# Patient Record
Sex: Female | Born: 1980 | Race: White | Hispanic: No | Marital: Married | Smoking: Never smoker
Health system: Southern US, Community
[De-identification: ages and names within clinical notes are randomized; demographics above are authoritative.]

## PROBLEM LIST (undated history)

## (undated) ENCOUNTER — Inpatient Hospital Stay (HOSPITAL_COMMUNITY): Payer: Self-pay

## (undated) DIAGNOSIS — Z3492 Encounter for supervision of normal pregnancy, unspecified, second trimester: Secondary | ICD-10-CM

## (undated) DIAGNOSIS — J45909 Unspecified asthma, uncomplicated: Secondary | ICD-10-CM

## (undated) HISTORY — DX: Unspecified asthma, uncomplicated: J45.909

## (undated) HISTORY — PX: WISDOM TOOTH EXTRACTION: SHX21

## (undated) HISTORY — DX: Encounter for supervision of normal pregnancy, unspecified, second trimester: Z34.92

---

## 2009-10-11 LAB — US OB DETAIL + 14 WK

## 2013-12-12 LAB — US OB COMP LESS 14 WKS

## 2013-12-22 LAB — OB RESULTS CONSOLE ABO/RH: RH Type: POSITIVE

## 2013-12-22 LAB — OB RESULTS CONSOLE ANTIBODY SCREEN: Antibody Screen: NEGATIVE

## 2013-12-22 LAB — OB RESULTS CONSOLE HIV ANTIBODY (ROUTINE TESTING): HIV: NONREACTIVE

## 2013-12-22 LAB — OB RESULTS CONSOLE HEPATITIS B SURFACE ANTIGEN: Hepatitis B Surface Ag: NEGATIVE

## 2013-12-22 LAB — OB RESULTS CONSOLE RUBELLA ANTIBODY, IGM: Rubella: IMMUNE

## 2013-12-22 LAB — OB RESULTS CONSOLE HGB/HCT, BLOOD
HCT: 38 %
Hemoglobin: 13.1 g/dL

## 2014-01-23 ENCOUNTER — Ambulatory Visit (INDEPENDENT_AMBULATORY_CARE_PROVIDER_SITE_OTHER): Payer: BC Managed Care – PPO | Admitting: Adult Health

## 2014-01-23 ENCOUNTER — Encounter: Payer: Self-pay | Admitting: Adult Health

## 2014-01-23 ENCOUNTER — Encounter (INDEPENDENT_AMBULATORY_CARE_PROVIDER_SITE_OTHER): Payer: Self-pay

## 2014-01-23 VITALS — BP 114/70 | Ht 65.0 in | Wt 168.0 lb

## 2014-01-23 DIAGNOSIS — O09299 Supervision of pregnancy with other poor reproductive or obstetric history, unspecified trimester: Secondary | ICD-10-CM

## 2014-01-23 DIAGNOSIS — Z1389 Encounter for screening for other disorder: Secondary | ICD-10-CM

## 2014-01-23 DIAGNOSIS — O34219 Maternal care for unspecified type scar from previous cesarean delivery: Secondary | ICD-10-CM

## 2014-01-23 DIAGNOSIS — Z3492 Encounter for supervision of normal pregnancy, unspecified, second trimester: Secondary | ICD-10-CM

## 2014-01-23 DIAGNOSIS — Z331 Pregnant state, incidental: Secondary | ICD-10-CM

## 2014-01-23 HISTORY — DX: Encounter for supervision of normal pregnancy, unspecified, second trimester: Z34.92

## 2014-01-23 LAB — POCT URINALYSIS DIPSTICK
Glucose, UA: NEGATIVE
Ketones, UA: NEGATIVE
Leukocytes, UA: NEGATIVE
Nitrite, UA: NEGATIVE
Protein, UA: NEGATIVE
RBC UA: NEGATIVE

## 2014-01-23 NOTE — Patient Instructions (Signed)
Second Trimester of Pregnancy The second trimester is from week 13 through week 28, months 4 through 6. The second trimester is often a time when you feel your best. Your body has also adjusted to being pregnant, and you begin to feel better physically. Usually, morning sickness has lessened or quit completely, you may have more energy, and you may have an increase in appetite. The second trimester is also a time when the fetus is growing rapidly. At the end of the sixth month, the fetus is about 9 inches long and weighs about 1 pounds. You will likely begin to feel the baby move (quickening) between 18 and 20 weeks of the pregnancy. BODY CHANGES Your body goes through many changes during pregnancy. The changes vary from woman to woman.   Your weight will continue to increase. You will notice your lower abdomen bulging out.  You may begin to get stretch marks on your hips, abdomen, and breasts.  You may develop headaches that can be relieved by medicines approved by your caregiver.  You may urinate more often because the fetus is pressing on your bladder.  You may develop or continue to have heartburn as a result of your pregnancy.  You may develop constipation because certain hormones are causing the muscles that push waste through your intestines to slow down.  You may develop hemorrhoids or swollen, bulging veins (varicose veins).  You may have back pain because of the weight gain and pregnancy hormones relaxing your joints between the bones in your pelvis and as a result of a shift in weight and the muscles that support your balance.  Your breasts will continue to grow and be tender.  Your gums may bleed and may be sensitive to brushing and flossing.  Dark spots or blotches (chloasma, mask of pregnancy) may develop on your face. This will likely fade after the baby is born.  A dark line from your belly button to the pubic area (linea nigra) may appear. This will likely fade after the  baby is born. WHAT TO EXPECT AT YOUR PRENATAL VISITS During a routine prenatal visit:  You will be weighed to make sure you and the fetus are growing normally.  Your blood pressure will be taken.  Your abdomen will be measured to track your baby's growth.  The fetal heartbeat will be listened to.  Any test results from the previous visit will be discussed. Your caregiver may ask you:  How you are feeling.  If you are feeling the baby move.  If you have had any abnormal symptoms, such as leaking fluid, bleeding, severe headaches, or abdominal cramping.  If you have any questions. Other tests that may be performed during your second trimester include:  Blood tests that check for:  Low iron levels (anemia).  Gestational diabetes (between 24 and 28 weeks).  Rh antibodies.  Urine tests to check for infections, diabetes, or protein in the urine.  An ultrasound to confirm the proper growth and development of the baby.  An amniocentesis to check for possible genetic problems.  Fetal screens for spina bifida and Down syndrome. HOME CARE INSTRUCTIONS   Avoid all smoking, herbs, alcohol, and unprescribed drugs. These chemicals affect the formation and growth of the baby.  Follow your caregiver's instructions regarding medicine use. There are medicines that are either safe or unsafe to take during pregnancy.  Exercise only as directed by your caregiver. Experiencing uterine cramps is a good sign to stop exercising.  Continue to eat regular,   healthy meals.  Wear a good support bra for breast tenderness.  Do not use hot tubs, steam rooms, or saunas.  Wear your seat belt at all times when driving.  Avoid raw meat, uncooked cheese, cat litter boxes, and soil used by cats. These carry germs that can cause birth defects in the baby.  Take your prenatal vitamins.  Try taking a stool softener (if your caregiver approves) if you develop constipation. Eat more high-fiber foods,  such as fresh vegetables or fruit and whole grains. Drink plenty of fluids to keep your urine clear or pale yellow.  Take warm sitz baths to soothe any pain or discomfort caused by hemorrhoids. Use hemorrhoid cream if your caregiver approves.  If you develop varicose veins, wear support hose. Elevate your feet for 15 minutes, 3 4 times a day. Limit salt in your diet.  Avoid heavy lifting, wear low heel shoes, and practice good posture.  Rest with your legs elevated if you have leg cramps or low back pain.  Visit your dentist if you have not gone yet during your pregnancy. Use a soft toothbrush to brush your teeth and be gentle when you floss.  A sexual relationship may be continued unless your caregiver directs you otherwise.  Continue to go to all your prenatal visits as directed by your caregiver. SEEK MEDICAL CARE IF:   You have dizziness.  You have mild pelvic cramps, pelvic pressure, or nagging pain in the abdominal area.  You have persistent nausea, vomiting, or diarrhea.  You have a bad smelling vaginal discharge.  You have pain with urination. SEEK IMMEDIATE MEDICAL CARE IF:   You have a fever.  You are leaking fluid from your vagina.  You have spotting or bleeding from your vagina.  You have severe abdominal cramping or pain.  You have rapid weight gain or loss.  You have shortness of breath with chest pain.  You notice sudden or extreme swelling of your face, hands, ankles, feet, or legs.  You have not felt your baby move in over an hour.  You have severe headaches that do not go away with medicine.  You have vision changes. Document Released: 10/13/2001 Document Revised: 06/21/2013 Document Reviewed: 12/20/2012 [facility] Patient Information 2014 [place], [place]. Return in 2 weeks for OB visit

## 2014-01-23 NOTE — Progress Notes (Signed)
No bleeding or cramping,declines AFP, will bring records next time,[place] will return in 2 weeks for OB visit... Had prior C section unsure what wants to do.

## 2014-02-06 ENCOUNTER — Encounter: Payer: BC Managed Care – PPO | Admitting: Women's Health

## 2014-02-14 ENCOUNTER — Encounter: Payer: BC Managed Care – PPO | Admitting: Women's Health

## 2014-02-21 ENCOUNTER — Other Ambulatory Visit (HOSPITAL_COMMUNITY)
Admission: RE | Admit: 2014-02-21 | Discharge: 2014-02-21 | Disposition: A | Discharge status: Home / Self Care | Payer: Medicaid Other | Source: Ambulatory Visit | Attending: Obstetrics & Gynecology | Admitting: Obstetrics & Gynecology

## 2014-02-21 ENCOUNTER — Encounter: Payer: Self-pay | Admitting: Women's Health

## 2014-02-21 ENCOUNTER — Ambulatory Visit (INDEPENDENT_AMBULATORY_CARE_PROVIDER_SITE_OTHER): Payer: Medicaid Other | Admitting: Women's Health

## 2014-02-21 VITALS — BP 120/70 | Wt 173.0 lb

## 2014-02-21 DIAGNOSIS — Z1151 Encounter for screening for human papillomavirus (HPV): Secondary | ICD-10-CM | POA: Insufficient documentation

## 2014-02-21 DIAGNOSIS — O34219 Maternal care for unspecified type scar from previous cesarean delivery: Secondary | ICD-10-CM

## 2014-02-21 DIAGNOSIS — Z01419 Encounter for gynecological examination (general) (routine) without abnormal findings: Secondary | ICD-10-CM | POA: Insufficient documentation

## 2014-02-21 DIAGNOSIS — Z1389 Encounter for screening for other disorder: Secondary | ICD-10-CM

## 2014-02-21 DIAGNOSIS — O09299 Supervision of pregnancy with other poor reproductive or obstetric history, unspecified trimester: Secondary | ICD-10-CM

## 2014-02-21 DIAGNOSIS — Z331 Pregnant state, incidental: Secondary | ICD-10-CM

## 2014-02-21 DIAGNOSIS — Z113 Encounter for screening for infections with a predominantly sexual mode of transmission: Secondary | ICD-10-CM | POA: Insufficient documentation

## 2014-02-21 DIAGNOSIS — Z3492 Encounter for supervision of normal pregnancy, unspecified, second trimester: Secondary | ICD-10-CM

## 2014-02-21 DIAGNOSIS — Z3480 Encounter for supervision of other normal pregnancy, unspecified trimester: Secondary | ICD-10-CM

## 2014-02-21 NOTE — Patient Instructions (Signed)
Second Trimester of Pregnancy The second trimester is from week 13 through week 28, months 4 through 6. The second trimester is often a time when you feel your best. Your body has also adjusted to being pregnant, and you begin to feel better physically. Usually, morning sickness has lessened or quit completely, you may have more energy, and you may have an increase in appetite. The second trimester is also a time when the fetus is growing rapidly. At the end of the sixth month, the fetus is about 9 inches long and weighs about 1 pounds. You will likely begin to feel the baby move (quickening) between 18 and 20 weeks of the pregnancy. BODY CHANGES Your body goes through many changes during pregnancy. The changes vary from woman to woman.   Your weight will continue to increase. You will notice your lower abdomen bulging out.  You may begin to get stretch marks on your hips, abdomen, and breasts.  You may develop headaches that can be relieved by medicines approved by your caregiver.  You may urinate more often because the fetus is pressing on your bladder.  You may develop or continue to have heartburn as a result of your pregnancy.  You may develop constipation because certain hormones are causing the muscles that push waste through your intestines to slow down.  You may develop hemorrhoids or swollen, bulging veins (varicose veins).  You may have back pain because of the weight gain and pregnancy hormones relaxing your joints between the bones in your pelvis and as a result of a shift in weight and the muscles that support your balance.  Your breasts will continue to grow and be tender.  Your gums may bleed and may be sensitive to brushing and flossing.  Dark spots or blotches (chloasma, mask of pregnancy) may develop on your face. This will likely fade after the baby is born.  A dark line from your belly button to the pubic area (linea nigra) may appear. This will likely fade after the  baby is born. WHAT TO EXPECT AT YOUR PRENATAL VISITS During a routine prenatal visit:  You will be weighed to make sure you and the fetus are growing normally.  Your blood pressure will be taken.  Your abdomen will be measured to track your baby's growth.  The fetal heartbeat will be listened to.  Any test results from the previous visit will be discussed. Your caregiver may ask you:  How you are feeling.  If you are feeling the baby move.  If you have had any abnormal symptoms, such as leaking fluid, bleeding, severe headaches, or abdominal cramping.  If you have any questions. Other tests that may be performed during your second trimester include:  Blood tests that check for:  Low iron levels (anemia).  Gestational diabetes (between 24 and 28 weeks).  Rh antibodies.  Urine tests to check for infections, diabetes, or protein in the urine.  An ultrasound to confirm the proper growth and development of the baby.  An amniocentesis to check for possible genetic problems.  Fetal screens for spina bifida and Down syndrome. HOME CARE INSTRUCTIONS   Avoid all smoking, herbs, alcohol, and unprescribed drugs. These chemicals affect the formation and growth of the baby.  Follow your caregiver's instructions regarding medicine use. There are medicines that are either safe or unsafe to take during pregnancy.  Exercise only as directed by your caregiver. Experiencing uterine cramps is a good sign to stop exercising.  Continue to eat regular,   healthy meals.  Wear a good support bra for breast tenderness.  Do not use hot tubs, steam rooms, or saunas.  Wear your seat belt at all times when driving.  Avoid raw meat, uncooked cheese, cat litter boxes, and soil used by cats. These carry germs that can cause birth defects in the baby.  Take your prenatal vitamins.  Try taking a stool softener (if your caregiver approves) if you develop constipation. Eat more high-fiber foods,  such as fresh vegetables or fruit and whole grains. Drink plenty of fluids to keep your urine clear or pale yellow.  Take warm sitz baths to soothe any pain or discomfort caused by hemorrhoids. Use hemorrhoid cream if your caregiver approves.  If you develop varicose veins, wear support hose. Elevate your feet for 15 minutes, 3 4 times a day. Limit salt in your diet.  Avoid heavy lifting, wear low heel shoes, and practice good posture.  Rest with your legs elevated if you have leg cramps or low back pain.  Visit your dentist if you have not gone yet during your pregnancy. Use a soft toothbrush to brush your teeth and be gentle when you floss.  A sexual relationship may be continued unless your caregiver directs you otherwise.  Continue to go to all your prenatal visits as directed by your caregiver. SEEK MEDICAL CARE IF:   You have dizziness.  You have mild pelvic cramps, pelvic pressure, or nagging pain in the abdominal area.  You have persistent nausea, vomiting, or diarrhea.  You have a bad smelling vaginal discharge.  You have pain with urination. SEEK IMMEDIATE MEDICAL CARE IF:   You have a fever.  You are leaking fluid from your vagina.  You have spotting or bleeding from your vagina.  You have severe abdominal cramping or pain.  You have rapid weight gain or loss.  You have shortness of breath with chest pain.  You notice sudden or extreme swelling of your face, hands, ankles, feet, or legs.  You have not felt your baby move in over an hour.  You have severe headaches that do not go away with medicine.  You have vision changes. Document Released: 10/13/2001 Document Revised: 06/21/2013 Document Reviewed: 12/20/2012 [employer] Patient Information 2014 [employer].  

## 2014-02-21 NOTE — Progress Notes (Signed)
Reports good fm. Denies uc's, lof, vb, uti s/s.  Pelvic congestion w/ labial swelling R>L. Recommended frequent breaks w/ legs/pelvis elevated, cool wet compresses throughout day. Was unable to get [place] records from [place]- states she only had 1 visit- early u/s and labs. Will get her to sign release so we can obtain them.  Prev c/s for breech discovered in labor- discussed VBAC, consent given to take home and review. Declines AFP. Reviewed ptl s/s, fm.  All questions answered. F/U in 1wk for anatomy u/s, then 4wks for visit.

## 2014-02-21 NOTE — Addendum Note (Signed)
Addended by: Colen DarlingYOUNG, Joleah Kosak S on: 02/21/2014 10:28 AM   Modules accepted: Orders

## 2014-02-28 ENCOUNTER — Encounter: Payer: Self-pay | Admitting: Women's Health

## 2014-03-01 ENCOUNTER — Ambulatory Visit (INDEPENDENT_AMBULATORY_CARE_PROVIDER_SITE_OTHER): Payer: Medicaid Other

## 2014-03-01 ENCOUNTER — Other Ambulatory Visit: Payer: Self-pay | Admitting: Women's Health

## 2014-03-01 DIAGNOSIS — O09299 Supervision of pregnancy with other poor reproductive or obstetric history, unspecified trimester: Secondary | ICD-10-CM

## 2014-03-01 DIAGNOSIS — O34219 Maternal care for unspecified type scar from previous cesarean delivery: Secondary | ICD-10-CM

## 2014-03-01 DIAGNOSIS — Z3480 Encounter for supervision of other normal pregnancy, unspecified trimester: Secondary | ICD-10-CM

## 2014-03-01 NOTE — Progress Notes (Signed)
U/S(20+1wks)-single active fetus, meas c/w dates, fluid wnl, no major abnl noted, cx appears closed(3.3 cm), bilateral adnexa appears wnl,anterior gr 0 placenta, female fetus, FHR- 141 bpm

## 2014-03-03 ENCOUNTER — Encounter: Payer: Self-pay | Admitting: Women's Health

## 2014-03-05 ENCOUNTER — Encounter: Payer: Self-pay | Admitting: *Deleted

## 2014-03-21 ENCOUNTER — Ambulatory Visit (INDEPENDENT_AMBULATORY_CARE_PROVIDER_SITE_OTHER): Payer: Medicaid Other | Admitting: Advanced Practice Midwife

## 2014-03-21 ENCOUNTER — Encounter: Payer: Self-pay | Admitting: Advanced Practice Midwife

## 2014-03-21 VITALS — BP 120/60 | Wt 179.0 lb

## 2014-03-21 DIAGNOSIS — Z3480 Encounter for supervision of other normal pregnancy, unspecified trimester: Secondary | ICD-10-CM

## 2014-03-21 DIAGNOSIS — Z1389 Encounter for screening for other disorder: Secondary | ICD-10-CM

## 2014-03-21 LAB — POCT URINALYSIS DIPSTICK
Blood, UA: NEGATIVE
Glucose, UA: NEGATIVE
KETONES UA: NEGATIVE
Leukocytes, UA: NEGATIVE
Nitrite, UA: NEGATIVE
Protein, UA: NEGATIVE

## 2014-03-21 NOTE — Patient Instructions (Signed)
1. Before your test, do not eat or drink anything for 8-10 hours prior to your  appointment (a small amount of water is allowed and you may take any medicines you normally take). Be sure to drink lots of water the day before. 2. When you arrive, your blood will be drawn for a 'fasting' blood sugar level.  Then you will be given a sweetened carbonated beverage to drink. You should  complete drinking this beverage within five minutes. After finishing the  beverage, you will have your blood drawn exactly 1 and 2 hours later. Having  your blood drawn on time is an important part of this test. A total of three blood  samples will be done. 3. The test takes approximately 2  hours. During the test, do not have anything to  eat or drink. Do not smoke, chew gum (not even sugarless gum) or use breath mints.  4. During the test you should remain close by and seated as much as possible and  avoid walking around. You may want to bring a book or something else to  occupy your time.  5. After your test, you may eat and drink as normal. You may want to bring a snack  to eat after the test is finished. Your provider will advise you as to the results of  this test and any follow-up if necessary  You will also be retested for syphilis, HIV and blood levels (anemia):  You were already tested in the first trimester, but [facility] recommends retesting.  Additionally, you will be tested for Type 2 Herpes. MOST people do not know that they have genital herpes, as only around 15% of people have outbreaks.  However, it is still transmittable to other people, including the baby (but only during the birth).  If you test positive for Type 2 Herpes, we place you on a medicine called acyclovir the last 6 weeks of your pregnancy to prevent transmission of the virus to the baby during the birth.    If your sugar test is positive for gestational diabetes, you will be given an phone call and further instructions discussed.   We typically do not call patients with positive herpes results, but will discuss it at your next appointment.  If you wish to know all of your test results before your next appointment, feel free to call the office, or look up your test results on Mychart.  (The range that the lab uses for normal values of the sugar test are not necessarily the range that is used for pregnant women; if your results are within the range, they are definitely normal.  However, if a value is deemed "high" by the lab, it may not be too high for a pregnant woman.  We will need to discuss the normal range if your value(s) fall in the "high" category).     

## 2014-03-21 NOTE — Progress Notes (Signed)
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  Routine questions about pregnancy answered.  F/U in 4 weeks for Low-risk ob appt/PN2.

## 2014-03-22 ENCOUNTER — Encounter: Payer: Self-pay | Admitting: Advanced Practice Midwife

## 2014-04-18 ENCOUNTER — Ambulatory Visit (INDEPENDENT_AMBULATORY_CARE_PROVIDER_SITE_OTHER): Payer: BC Managed Care – PPO | Admitting: Women's Health

## 2014-04-18 ENCOUNTER — Encounter: Payer: Self-pay | Admitting: Women's Health

## 2014-04-18 ENCOUNTER — Other Ambulatory Visit: Payer: BC Managed Care – PPO

## 2014-04-18 VITALS — BP 120/62 | Wt 183.0 lb

## 2014-04-18 DIAGNOSIS — Z3480 Encounter for supervision of other normal pregnancy, unspecified trimester: Secondary | ICD-10-CM

## 2014-04-18 DIAGNOSIS — Z1389 Encounter for screening for other disorder: Secondary | ICD-10-CM

## 2014-04-18 DIAGNOSIS — Z331 Pregnant state, incidental: Secondary | ICD-10-CM

## 2014-04-18 DIAGNOSIS — Z3492 Encounter for supervision of normal pregnancy, unspecified, second trimester: Secondary | ICD-10-CM

## 2014-04-18 LAB — POCT URINALYSIS DIPSTICK
Blood, UA: NEGATIVE
Glucose, UA: NEGATIVE
KETONES UA: NEGATIVE
Leukocytes, UA: NEGATIVE
Nitrite, UA: NEGATIVE

## 2014-04-18 LAB — CBC
HCT: 33.7 % — ABNORMAL LOW (ref 36.0–46.0)
Hemoglobin: 11.8 g/dL — ABNORMAL LOW (ref 12.0–15.0)
MCH: 30.7 pg (ref 26.0–34.0)
MCHC: 35 g/dL (ref 30.0–36.0)
MCV: 87.8 fL (ref 78.0–100.0)
Platelets: 237 10*3/uL (ref 150–400)
RBC: 3.84 MIL/uL — ABNORMAL LOW (ref 3.87–5.11)
RDW: 13.5 % (ref 11.5–15.5)
WBC: 9.6 10*3/uL (ref 4.0–10.5)

## 2014-04-18 NOTE — Progress Notes (Signed)
Low-risk OB appointment [id number] Estimated Date of Delivery: 07/05/14 Blood pressure 120/62, weight 183 lb (83.008 kg), last menstrual period 10/14/2013.  BP, weight, and urine results all reviewed and noted.  Please refer to the obstetrical flow sheet for the fundal height and fetal heart rate documentation. Reports good fm.  Denies regular uc's, lof, vb, or uti s/s. Still w/ Rt sided vulvar varicosity/swelling- using maternity belt and ice when it's bad.  Discussed VBAC vs. RLTCS, leaning more towards c/s at this time, still not sure.  [place] [facility] [place], [facility] based on 10wk u/s which was 2wks diff from LMP, so she is actually 28.6wks today, not 27.0 Reviewed ptl s/s, fkc. Plan:  Continued routine obstetrical care, continue maternity belt/ice for vulvar varicosities F/U in 2wks for OB appointment PN2 today

## 2014-04-18 NOTE — Patient Instructions (Signed)
[place] Pediatricians:  [facility] [phone]            [facility] [phone]                 [facility] [phone] (usually doesn't accept new patients unless you have family there already, you are always welcome to call and ask)             [facility] ([address] ) [phone]   [facility]:   [facility]: [phone]  [facility]: [phone]   Third Trimester of Pregnancy The third trimester is from week 29 through week 42, months 7 through 9. The third trimester is a time when the fetus is growing rapidly. At the end of the ninth month, the fetus is about 20 inches in length and weighs 6-10 pounds.  BODY CHANGES Your body goes through many changes during pregnancy. The changes vary from woman to woman.   Your weight will continue to increase. You can expect to gain 25-35 pounds (11-16 kg) by the end of the pregnancy.  You may begin to get stretch marks on your hips, abdomen, and breasts.  You may urinate more often because the fetus is moving lower into your pelvis and pressing on your bladder.  You may develop or continue to have heartburn as a result of your pregnancy.  You may develop constipation because certain hormones are causing the muscles that push waste through your intestines to slow down.  You may develop hemorrhoids or swollen, bulging veins (varicose veins).  You may have pelvic pain because of the weight gain and pregnancy hormones relaxing your joints between the bones in your pelvis. Backaches may result from overexertion of the muscles supporting your posture.  You may have changes in your hair. These can include thickening of your hair, rapid growth, and changes in texture. Some women also have hair loss during or after pregnancy, or hair that feels dry or thin. Your hair will most likely return to normal after your baby  is born.  Your breasts will continue to grow and be tender. A yellow discharge may leak from your breasts called colostrum.  Your belly button may stick out.  You may feel short of breath because of your expanding uterus.  You may notice the fetus "dropping," or moving lower in your abdomen.  You may have a bloody mucus discharge. This usually occurs a few days to a week before labor begins.  Your cervix becomes thin and soft (effaced) near your due date. WHAT TO EXPECT AT YOUR PRENATAL EXAMS  You will have prenatal exams every 2 weeks until week 36. Then, you will have weekly prenatal exams. During a routine prenatal visit:  You will be weighed to make sure you and the fetus are growing normally.  Your blood pressure is taken.  Your abdomen will be measured to track your baby's growth.  The fetal heartbeat will be listened to.  Any test results from the previous visit will be discussed.  You may have a cervical check near your due date to see if you have effaced. At around 36 weeks, your caregiver will check your cervix. At the same time, your caregiver will also perform a test on the secretions of the vaginal tissue. This test is to determine if a type of bacteria, Group B streptococcus, is present. Your caregiver will explain this further. Your caregiver may ask you:  What your birth plan  is.  How you are feeling.  If you are feeling the baby move.  If you have had any abnormal symptoms, such as leaking fluid, bleeding, severe headaches, or abdominal cramping.  If you have any questions. Other tests or screenings that may be performed during your third trimester include:  Blood tests that check for low iron levels (anemia).  Fetal testing to check the health, activity level, and growth of the fetus. Testing is done if you have certain medical conditions or if there are problems during the pregnancy. FALSE LABOR You may feel small, irregular contractions that eventually  go away. These are called Braxton Hicks contractions, or false labor. Contractions may last for hours, days, or even weeks before true labor sets in. If contractions come at regular intervals, intensify, or become painful, it is best to be seen by your caregiver.  SIGNS OF LABOR   Menstrual-like cramps.  Contractions that are 5 minutes apart or less.  Contractions that start on the top of the uterus and spread down to the lower abdomen and back.  A sense of increased pelvic pressure or back pain.  A watery or bloody mucus discharge that comes from the vagina. If you have any of these signs before the 37th week of pregnancy, call your caregiver right away. You need to go to the hospital to get checked immediately. HOME CARE INSTRUCTIONS   Avoid all smoking, herbs, alcohol, and unprescribed drugs. These chemicals affect the formation and growth of the baby.  Follow your caregiver's instructions regarding medicine use. There are medicines that are either safe or unsafe to take during pregnancy.  Exercise only as directed by your caregiver. Experiencing uterine cramps is a good sign to stop exercising.  Continue to eat regular, healthy meals.  Wear a good support bra for breast tenderness.  Do not use hot tubs, steam rooms, or saunas.  Wear your seat belt at all times when driving.  Avoid raw meat, uncooked cheese, cat litter boxes, and soil used by cats. These carry germs that can cause birth defects in the baby.  Take your prenatal vitamins.  Try taking a stool softener (if your caregiver approves) if you develop constipation. Eat more high-fiber foods, such as fresh vegetables or fruit and whole grains. Drink plenty of fluids to keep your urine clear or pale yellow.  Take warm sitz baths to soothe any pain or discomfort caused by hemorrhoids. Use hemorrhoid cream if your caregiver approves.  If you develop varicose veins, wear support hose. Elevate your feet for 15 minutes, 3-4  times a day. Limit salt in your diet.  Avoid heavy lifting, wear low heal shoes, and practice good posture.  Rest a lot with your legs elevated if you have leg cramps or low back pain.  Visit your dentist if you have not gone during your pregnancy. Use a soft toothbrush to brush your teeth and be gentle when you floss.  A sexual relationship may be continued unless your caregiver directs you otherwise.  Do not travel far distances unless it is absolutely necessary and only with the approval of your caregiver.  Take prenatal classes to understand, practice, and ask questions about the labor and delivery.  Make a trial run to the hospital.  Pack your hospital bag.  Prepare the baby's nursery.  Continue to go to all your prenatal visits as directed by your caregiver. SEEK MEDICAL CARE IF:  You are unsure if you are in labor or if your water has broken.  You have dizziness.  You have mild pelvic cramps, pelvic pressure, or nagging pain in your abdominal area.  You have persistent nausea, vomiting, or diarrhea.  You have a bad smelling vaginal discharge.  You have pain with urination. SEEK IMMEDIATE MEDICAL CARE IF:   You have a fever.  You are leaking fluid from your vagina.  You have spotting or bleeding from your vagina.  You have severe abdominal cramping or pain.  You have rapid weight loss or gain.  You have shortness of breath with chest pain.  You notice sudden or extreme swelling of your face, hands, ankles, feet, or legs.  You have not felt your baby move in over an hour.  You have severe headaches that do not go away with medicine.  You have vision changes. Document Released: 10/13/2001 Document Revised: 10/24/2013 Document Reviewed: 12/20/2012 [facility] Patient Information 2015 [employer] [place]. This information is not intended to replace advice given to you by your health care provider. Make sure you discuss any questions you have with your health care  provider.

## 2014-04-19 LAB — RPR

## 2014-04-19 LAB — GLUCOSE TOLERANCE, 2 HOURS W/ 1HR
GLUCOSE, FASTING: 89 mg/dL (ref 70–99)
GLUCOSE: 123 mg/dL (ref 70–170)
Glucose, 2 hour: 74 mg/dL (ref 70–139)

## 2014-04-19 LAB — ANTIBODY SCREEN: Antibody Screen: NEGATIVE

## 2014-04-19 LAB — HIV ANTIBODY (ROUTINE TESTING W REFLEX): HIV 1&2 Ab, 4th Generation: NONREACTIVE

## 2014-04-19 LAB — HSV 2 ANTIBODY, IGG: HSV 2 Glycoprotein G Ab, IgG: 0.1 IV

## 2014-04-24 ENCOUNTER — Encounter: Payer: Self-pay | Admitting: Women's Health

## 2014-05-02 ENCOUNTER — Encounter: Payer: Self-pay | Admitting: Obstetrics & Gynecology

## 2014-05-02 ENCOUNTER — Ambulatory Visit (INDEPENDENT_AMBULATORY_CARE_PROVIDER_SITE_OTHER): Payer: BC Managed Care – PPO | Admitting: Obstetrics & Gynecology

## 2014-05-02 VITALS — BP 100/60 | Wt 180.4 lb

## 2014-05-02 DIAGNOSIS — Z331 Pregnant state, incidental: Secondary | ICD-10-CM

## 2014-05-02 DIAGNOSIS — Z3480 Encounter for supervision of other normal pregnancy, unspecified trimester: Secondary | ICD-10-CM

## 2014-05-02 DIAGNOSIS — Z1389 Encounter for screening for other disorder: Secondary | ICD-10-CM

## 2014-05-02 LAB — POCT URINALYSIS DIPSTICK
Blood, UA: NEGATIVE
Glucose, UA: NEGATIVE
KETONES UA: NEGATIVE
Leukocytes, UA: NEGATIVE
Nitrite, UA: NEGATIVE
Protein, UA: NEGATIVE

## 2014-05-02 NOTE — Progress Notes (Signed)
[id number] 4457w6d Estimated Date of Delivery: 07/05/14  Blood pressure 100/60, weight 180 lb 6.4 oz (81.829 kg), last menstrual period 10/14/2013.   BP weight and urine results all reviewed and noted.  Please refer to the obstetrical flow sheet for the fundal height and fetal heart rate documentation:  Patient reports good fetal movement, denies any bleeding and no rupture of membranes symptoms or regular contractions. Patient is without complaints. All questions were answered.  Plan:  Continued routine obstetrical care, considering VBAC   Follow up in 2 weeks for OB appointment, routine

## 2014-05-16 ENCOUNTER — Encounter: Payer: BC Managed Care – PPO | Admitting: Advanced Practice Midwife

## 2014-05-23 ENCOUNTER — Ambulatory Visit (INDEPENDENT_AMBULATORY_CARE_PROVIDER_SITE_OTHER): Payer: BC Managed Care – PPO | Admitting: Advanced Practice Midwife

## 2014-05-23 ENCOUNTER — Encounter: Payer: Self-pay | Admitting: Advanced Practice Midwife

## 2014-05-23 VITALS — BP 100/70 | Wt 178.0 lb

## 2014-05-23 DIAGNOSIS — Z1389 Encounter for screening for other disorder: Secondary | ICD-10-CM

## 2014-05-23 DIAGNOSIS — Z3480 Encounter for supervision of other normal pregnancy, unspecified trimester: Secondary | ICD-10-CM

## 2014-05-23 DIAGNOSIS — Z3483 Encounter for supervision of other normal pregnancy, third trimester: Secondary | ICD-10-CM

## 2014-05-23 DIAGNOSIS — Z331 Pregnant state, incidental: Secondary | ICD-10-CM

## 2014-05-23 DIAGNOSIS — O34219 Maternal care for unspecified type scar from previous cesarean delivery: Secondary | ICD-10-CM

## 2014-05-23 LAB — POCT URINALYSIS DIPSTICK
Blood, UA: NEGATIVE
GLUCOSE UA: NEGATIVE
KETONES UA: NEGATIVE
LEUKOCYTES UA: NEGATIVE
Nitrite, UA: NEGATIVE
Protein, UA: NEGATIVE

## 2014-05-23 NOTE — Progress Notes (Signed)
[id number] 9174w6d Estimated Date of Delivery: 07/05/14  Blood pressure 100/70, weight 178 lb (80.74 kg), last menstrual period 10/14/2013.   BP weight and urine results all reviewed and noted.  Please refer to the obstetrical flow sheet for the fundal height and fetal heart rate documentation:  Patient reports good fetal movement, denies any bleeding and no rupture of membranes symptoms or regular contractions. Patient is without complaints. All questions were answered.  Plan:  Continued routine obstetrical care, plans RLTCS "the risks are too scary"  Follow up in 2 weeks for OB appointment,

## 2014-05-29 ENCOUNTER — Encounter (HOSPITAL_COMMUNITY): Payer: Self-pay | Admitting: *Deleted

## 2014-05-29 ENCOUNTER — Ambulatory Visit (INDEPENDENT_AMBULATORY_CARE_PROVIDER_SITE_OTHER): Payer: BC Managed Care – PPO | Admitting: Women's Health

## 2014-05-29 ENCOUNTER — Encounter: Payer: Self-pay | Admitting: Women's Health

## 2014-05-29 ENCOUNTER — Inpatient Hospital Stay (HOSPITAL_COMMUNITY)
Admission: AD | Admit: 2014-05-29 | Discharge: 2014-05-30 | DRG: 781 | Disposition: A | Discharge status: Home / Self Care | Payer: Medicaid Other | Source: Ambulatory Visit | Attending: Obstetrics & Gynecology | Admitting: Obstetrics & Gynecology

## 2014-05-29 VITALS — BP 88/52 | HR 116 | Temp 101.7°F

## 2014-05-29 DIAGNOSIS — Z8249 Family history of ischemic heart disease and other diseases of the circulatory system: Secondary | ICD-10-CM

## 2014-05-29 DIAGNOSIS — O2390 Unspecified genitourinary tract infection in pregnancy, unspecified trimester: Principal | ICD-10-CM | POA: Diagnosis present

## 2014-05-29 DIAGNOSIS — O34219 Maternal care for unspecified type scar from previous cesarean delivery: Secondary | ICD-10-CM | POA: Diagnosis present

## 2014-05-29 DIAGNOSIS — O212 Late vomiting of pregnancy: Secondary | ICD-10-CM | POA: Diagnosis present

## 2014-05-29 DIAGNOSIS — N12 Tubulo-interstitial nephritis, not specified as acute or chronic: Secondary | ICD-10-CM | POA: Diagnosis present

## 2014-05-29 DIAGNOSIS — O9989 Other specified diseases and conditions complicating pregnancy, childbirth and the puerperium: Secondary | ICD-10-CM

## 2014-05-29 DIAGNOSIS — Z331 Pregnant state, incidental: Secondary | ICD-10-CM

## 2014-05-29 DIAGNOSIS — Z825 Family history of asthma and other chronic lower respiratory diseases: Secondary | ICD-10-CM

## 2014-05-29 DIAGNOSIS — J45909 Unspecified asthma, uncomplicated: Secondary | ICD-10-CM

## 2014-05-29 DIAGNOSIS — O368390 Maternal care for abnormalities of the fetal heart rate or rhythm, unspecified trimester, not applicable or unspecified: Secondary | ICD-10-CM | POA: Diagnosis not present

## 2014-05-29 DIAGNOSIS — O98513 Other viral diseases complicating pregnancy, third trimester: Secondary | ICD-10-CM

## 2014-05-29 DIAGNOSIS — Z1389 Encounter for screening for other disorder: Secondary | ICD-10-CM

## 2014-05-29 DIAGNOSIS — Z3483 Encounter for supervision of other normal pregnancy, third trimester: Secondary | ICD-10-CM

## 2014-05-29 DIAGNOSIS — M549 Dorsalgia, unspecified: Secondary | ICD-10-CM | POA: Diagnosis not present

## 2014-05-29 DIAGNOSIS — O98519 Other viral diseases complicating pregnancy, unspecified trimester: Secondary | ICD-10-CM

## 2014-05-29 DIAGNOSIS — Z833 Family history of diabetes mellitus: Secondary | ICD-10-CM

## 2014-05-29 DIAGNOSIS — O2300 Infections of kidney in pregnancy, unspecified trimester: Secondary | ICD-10-CM

## 2014-05-29 LAB — URINALYSIS, ROUTINE W REFLEX MICROSCOPIC
Glucose, UA: NEGATIVE mg/dL
Hgb urine dipstick: NEGATIVE
Ketones, ur: 15 mg/dL — AB
NITRITE: POSITIVE — AB
PH: 6 (ref 5.0–8.0)
Protein, ur: 30 mg/dL — AB
SPECIFIC GRAVITY, URINE: 1.015 (ref 1.005–1.030)
UROBILINOGEN UA: 4 mg/dL — AB (ref 0.0–1.0)

## 2014-05-29 LAB — URINE MICROSCOPIC-ADD ON

## 2014-05-29 LAB — COMPREHENSIVE METABOLIC PANEL
ALBUMIN: 2.2 g/dL — AB (ref 3.5–5.2)
ALT: 11 U/L (ref 0–35)
AST: 19 U/L (ref 0–37)
Alkaline Phosphatase: 162 U/L — ABNORMAL HIGH (ref 39–117)
Anion gap: 13 (ref 5–15)
BUN: 7 mg/dL (ref 6–23)
CALCIUM: 8.5 mg/dL (ref 8.4–10.5)
CO2: 20 mEq/L (ref 19–32)
Chloride: 100 mEq/L (ref 96–112)
Creatinine, Ser: 0.68 mg/dL (ref 0.50–1.10)
GFR calc Af Amer: >90 mL/min (ref >90)
GFR calc non Af Amer: >90 mL/min (ref >90)
GLUCOSE: 116 mg/dL — AB (ref 70–99)
Potassium: 3.3 mEq/L — ABNORMAL LOW (ref 3.7–5.3)
Sodium: 133 mEq/L — ABNORMAL LOW (ref 137–147)
TOTAL PROTEIN: 5.5 g/dL — AB (ref 6.0–8.3)
Total Bilirubin: 0.8 mg/dL (ref 0.3–1.2)

## 2014-05-29 LAB — CBC WITH DIFFERENTIAL/PLATELET
BASOS PCT: 0 % (ref 0–1)
Basophils Absolute: 0 10*3/uL (ref 0.0–0.1)
EOS ABS: 0 10*3/uL (ref 0.0–0.7)
EOS PCT: 0 % (ref 0–5)
HCT: 33.3 % — ABNORMAL LOW (ref 36.0–46.0)
HEMOGLOBIN: 11.1 g/dL — AB (ref 12.0–15.0)
Lymphocytes Relative: 1 % — ABNORMAL LOW (ref 12–46)
Lymphs Abs: 0.2 10*3/uL — ABNORMAL LOW (ref 0.7–4.0)
MCH: 29.4 pg (ref 26.0–34.0)
MCHC: 33.3 g/dL (ref 30.0–36.0)
MCV: 88.1 fL (ref 78.0–100.0)
MONO ABS: 0.3 10*3/uL (ref 0.1–1.0)
MONOS PCT: 2 % — AB (ref 3–12)
NEUTROS PCT: 97 % — AB (ref 43–77)
Neutro Abs: 12.3 10*3/uL — ABNORMAL HIGH (ref 1.7–7.7)
Platelets: 149 10*3/uL — ABNORMAL LOW (ref 150–400)
RBC: 3.78 MIL/uL — ABNORMAL LOW (ref 3.87–5.11)
RDW: 13.4 % (ref 11.5–15.5)
WBC: 12.9 10*3/uL — ABNORMAL HIGH (ref 4.0–10.5)

## 2014-05-29 MED ORDER — ACETAMINOPHEN 500 MG PO TABS: 1000.0000 mg | ORAL_TABLET | Freq: Once | ORAL | Status: DC

## 2014-05-29 MED ORDER — NIFEDIPINE 10 MG PO CAPS: 10.0000 mg | ORAL_CAPSULE | Freq: Four times a day (QID) | ORAL | Status: DC

## 2014-05-29 MED ORDER — POTASSIUM CHLORIDE CRYS ER 20 MEQ PO TBCR
20.0000 meq | EXTENDED_RELEASE_TABLET | Freq: Two times a day (BID) | ORAL | Status: AC
  Administered 2014-05-29: 20 meq via ORAL
  Filled 2014-05-29: qty 1

## 2014-05-29 MED ORDER — SODIUM CHLORIDE 0.9 % IV SOLN
INTRAVENOUS | Status: DC
  Administered 2014-05-29 – 2014-05-30 (×3): via INTRAVENOUS

## 2014-05-29 MED ORDER — ZOLPIDEM TARTRATE 5 MG PO TABS: 5.0000 mg | ORAL_TABLET | Freq: Every evening | ORAL | Status: DC | PRN

## 2014-05-29 MED ORDER — LACTATED RINGERS IV BOLUS (SEPSIS)
1000.0000 mL | Freq: Once | INTRAVENOUS | Status: AC
  Administered 2014-05-29: 1000 mL via INTRAVENOUS

## 2014-05-29 MED ORDER — OXYCODONE-ACETAMINOPHEN 5-325 MG PO TABS
2.0000 | ORAL_TABLET | ORAL | Status: DC | PRN
  Administered 2014-05-29: 2 via ORAL
  Filled 2014-05-29: qty 2

## 2014-05-29 MED ORDER — ACETAMINOPHEN 325 MG PO TABS: 650.0000 mg | ORAL_TABLET | ORAL | Status: DC | PRN

## 2014-05-29 MED ORDER — NIFEDIPINE 10 MG PO CAPS: 20.0000 mg | ORAL_CAPSULE | Freq: Once | ORAL | Status: DC

## 2014-05-29 MED ORDER — CALCIUM CARBONATE ANTACID 500 MG PO CHEW: 2.0000 | CHEWABLE_TABLET | ORAL | Status: DC | PRN

## 2014-05-29 MED ORDER — DEXTROSE 5 % IV SOLN
1.0000 g | Freq: Two times a day (BID) | INTRAVENOUS | Status: DC
  Administered 2014-05-29 – 2014-05-30 (×3): 1 g via INTRAVENOUS
  Filled 2014-05-29 (×4): qty 10

## 2014-05-29 MED ORDER — PRENATAL MULTIVITAMIN CH
1.0000 | ORAL_TABLET | Freq: Every day | ORAL | Status: DC
  Administered 2014-05-30: 1 via ORAL
  Filled 2014-05-29: qty 1

## 2014-05-29 MED ORDER — DOCUSATE SODIUM 100 MG PO CAPS
100.0000 mg | ORAL_CAPSULE | Freq: Every day | ORAL | Status: DC
  Administered 2014-05-30: 100 mg via ORAL
  Filled 2014-05-29: qty 1

## 2014-05-29 MED ORDER — HYDROMORPHONE HCL PF 1 MG/ML IJ SOLN
1.0000 mg | INTRAMUSCULAR | Status: DC | PRN
  Administered 2014-05-29 – 2014-05-30 (×3): 1 mg via INTRAVENOUS
  Filled 2014-05-29 (×3): qty 1

## 2014-05-29 NOTE — MAU Note (Signed)
Patient states she woke up this am with severe back pain that radiates down both legs. Had a low grade fever but in the office was 101. Denies bleeding or leaking and reports good fetal movement.

## 2014-05-29 NOTE — Progress Notes (Signed)
Work-in Low-risk OB appointment G3P1011 2822w5d Estimated Date of Delivery: 07/05/14 BP 80/44  Pulse 114  Temp(Src) 99.2 F (37.3 C)  LMP 10/14/2013  BP, weight reviewed.  Unable to void. Refer to obstetrical flow sheet for FH & FHR.  Reports good fm.  Denies regular uc's, lof, vb, or uti s/s.  Woke up this am, felt fine, then all of a sudden got severe low back pain/going down into bilateral legs, n/v- hasn't been able to keep food/fluids down. + pelvic pressure. Denies diarrhea. No abd pain. +fever/chills. Unable to tell if she is having uc's. 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  In severe pain, checked cx to make sure not laboring: 1/50/-3, vtx- doesn't feel like laboring cx. No CVAT, no trigger points in back. Hooked up to NST, FHR 190s. Rechecked pt's temp, now 101.7. Feels a little better lying on side.  Co-exam w/ LHE, feels probably viral w/ severe body aches- recommended going to [facility] for [facility]/eval. Pt drove herself here, has someone coming to pick her up- to go straight to [facility] for ivf/eval Keep next appt w/ us as scheduled on 8/4 Notified Dr. Jaquita RectorMelancon, resident to expect pt NST- tachycardic, good variability, no decels, 10x10accels, some UI- not perceived by pt

## 2014-05-29 NOTE — MAU Note (Signed)
Multiple episodes of vomiting since this am.

## 2014-05-29 NOTE — H&P (Signed)
Misty Salas is a 33 y.o. female G3P1011 with IUP at [redacted]w[redacted]d presenting for acute back pain, Nausea and Vomiting. Pt states she has been having none contractions, associated with none vaginal bleeding.  Membranes are intact, with active fetal movement.   [facility]  Prenatal History/Complications: Pt. Presents with acute onset lower back pain with nausea and vomiting this am. She says that she was on her way to work, and began feeling bad and subsequently developed severe lower back pain with nausea and vomiting. She endorses fever and chills. She denies diarrhea. She has not had any family member sick contacts. She denies dysuria, or hematuria. She denies abdominal pain. Her pregnancy has been uncomplicated to this point. Her previous pregnancy was a cesarean delivery for breech presentation at term.    Past Medical History: Past Medical History  Diagnosis Date  . Asthma   . Supervision of normal pregnancy in second trimester 01/23/2014    Past Surgical History: Past Surgical History  Procedure Laterality Date  . Cesarean section    . Wisdom tooth extraction      Obstetrical History: OB History   Grav Para Term Preterm Abortions TAB SAB Ect Mult Living   3 1 1  1     1       Gynecological History: OB History   Grav Para Term Preterm Abortions TAB SAB Ect Mult Living   3 1 1  1     1       Social History: History   Social History  . Marital Status: Married    Spouse Name: N/A    Number of Children: N/A  . Years of Education: N/A   Social History Main Topics  . Smoking status: Never Smoker   . Smokeless tobacco: Never Used  . Alcohol Use: No  . Drug Use: No  . Sexual Activity: Yes    Birth Control/ Protection: None   Other Topics Concern  . None   Social History Narrative  . None    Family History: Family History  Problem Relation Age of Onset  . Asthma Sister   . Diabetes Paternal Uncle   . Cancer Paternal Uncle     skin  . Cancer  Maternal Grandmother     ovarian   . Heart attack Maternal Grandfather   . Diabetes Paternal Grandfather     Allergies: No Known Allergies  Prescriptions prior to admission  Medication Sig Dispense Refill  . Prenatal Vit-Fe Fumarate-FA (PRENATAL MULTIVITAMIN) TABS tablet Take 1 tablet by mouth daily at 12 noon.      . Albuterol Sulfate (PROAIR HFA IN) Inhale 2 puffs into the lungs as needed.         Review of Systems   Per HPI  Blood pressure 97/67, pulse 121, temperature 99.3 F (37.4 C), temperature source Oral, resp. rate 16, height 5' 8.5" (1.74 m), weight 81.375 kg (179 lb 6.4 oz), last menstrual period 10/14/2013, SpO2 98.00%. General appearance: alert, cooperative and no distress Lungs: clear to auscultation bilaterally Heart: regular rate and rhythm Abdomen: soft, non-tender; bowel sounds normal Pelvic: 1cm, 50%, -3 Extremities: Homans sign is negative, no sign of DVT Grossly neurologically intact Presentation: cephalic Fetal monitoringBaseline: 150 bpm, Variability: Good {> 6 bpm), Accelerations: Reactive and Decelerations: Absent No Contractions.  Dilation: 1 Effacement (%): 50 Exam by:: Dr. Jaquita Rector   Prenatal labs: ABO, Rh: O/Positive/-- (02/20 0000) Antibody: NEG (06/17 0908) Rubella:   RPR: NON REAC (06/17 0908)  HBsAg: Negative (02/20 0000)  HIV: NONREACTIVE (06/17 0908)  GBS:   Unknown 1 hr Glucola 123 Genetic screening  - Declined Anatomy US Normal   Prenatal Transfer Tool  Maternal Diabetes: No Genetic Screening: Declined Maternal Ultrasounds/Referrals: Normal Fetal Ultrasounds or other Referrals:  None Maternal Substance Abuse:  No Significant Maternal Medications:  None Significant Maternal Lab Results: None     Results for orders placed during the hospital encounter of 05/29/14 (from the past 24 hour(s))  URINALYSIS, ROUTINE W REFLEX MICROSCOPIC   Collection Time    05/29/14 12:50 PM      Result Value Ref Range   Color, Urine  ORANGE (*) YELLOW   APPearance HAZY (*) CLEAR   Specific Gravity, Urine 1.015  1.005 - 1.030   pH 6.0  5.0 - 8.0   Glucose, UA NEGATIVE  NEGATIVE mg/dL   Hgb urine dipstick NEGATIVE  NEGATIVE   Bilirubin Urine MODERATE (*) NEGATIVE   Ketones, ur 15 (*) NEGATIVE mg/dL   Protein, ur 30 (*) NEGATIVE mg/dL   Urobilinogen, UA 4.0 (*) 0.0 - 1.0 mg/dL   Nitrite POSITIVE (*) NEGATIVE   Leukocytes, UA SMALL (*) NEGATIVE  URINE MICROSCOPIC-ADD ON   Collection Time    05/29/14 12:50 PM      Result Value Ref Range   Squamous Epithelial / LPF FEW (*) RARE   WBC, UA 0-2  <3 WBC/hpf   Bacteria, UA FEW (*) RARE   Casts HYALINE CASTS (*) NEGATIVE   Urine-Other MUCOUS PRESENT    CBC WITH DIFFERENTIAL   Collection Time    05/29/14  2:07 PM      Result Value Ref Range   WBC 12.9 (*) 4.0 - 10.5 K/uL   RBC 3.78 (*) 3.87 - 5.11 MIL/uL   Hemoglobin 11.1 (*) 12.0 - 15.0 g/dL   HCT 40.933.3 (*) 81.136.0 - 91.446.0 %   MCV 88.1  78.0 - 100.0 fL   MCH 29.4  26.0 - 34.0 pg   MCHC 33.3  30.0 - 36.0 g/dL   RDW 78.213.4  95.611.5 - 21.315.5 %   Platelets 149 (*) 150 - 400 K/uL   Neutrophils Relative % 97 (*) 43 - 77 %   Neutro Abs 12.3 (*) 1.7 - 7.7 K/uL   Lymphocytes Relative 1 (*) 12 - 46 %   Lymphs Abs 0.2 (*) 0.7 - 4.0 K/uL   Monocytes Relative 2 (*) 3 - 12 %   Monocytes Absolute 0.3  0.1 - 1.0 K/uL   Eosinophils Relative 0  0 - 5 %   Eosinophils Absolute 0.0  0.0 - 0.7 K/uL   Basophils Relative 0  0 - 1 %   Basophils Absolute 0.0  0.0 - 0.1 K/uL  COMPREHENSIVE METABOLIC PANEL   Collection Time    05/29/14  2:07 PM      Result Value Ref Range   Sodium 133 (*) 137 - 147 mEq/L   Potassium 3.3 (*) 3.7 - 5.3 mEq/L   Chloride 100  96 - 112 mEq/L   CO2 20  19 - 32 mEq/L   Glucose, Bld 116 (*) 70 - 99 mg/dL   BUN 7  6 - 23 mg/dL   Creatinine, Ser 0.860.68  0.50 - 1.10 mg/dL   Calcium 8.5  8.4 - 57.810.5 mg/dL   Total Protein 5.5 (*) 6.0 - 8.3 g/dL   Albumin 2.2 (*) 3.5 - 5.2 g/dL   AST 19  0 - 37 U/L   ALT 11  0 -  35 U/L    Alkaline Phosphatase 162 (*) 39 - 117 U/L   Total Bilirubin 0.8  0.3 - 1.2 mg/dL   GFR calc non Af Amer >90  >90 mL/min   GFR calc Af Amer >90  >90 mL/min   Anion gap 13  5 - 15    Assessment: Misty Salas is a 33 y.o. G3P1011 at [redacted]w[redacted]d by  here for rule out pyelonephritis.   1. Admit to Antenatal  - Ceftriaxone 1g q12hr.  - Percocet / Dilaudid for pain control  - IV Fluids 125cc/hr.  - Will monitor for improvement.    2. IUP @ [redacted]W[redacted]D - Periodic fetal tachycardia. Predominant baseline 150BPM, otherwise reassuring FHT.  - Cervical exam reassuring - Fetal monitoring . Q shift.   Melancon, Hillery Hunter 05/29/2014, 6:53 PM

## 2014-05-30 DIAGNOSIS — O2390 Unspecified genitourinary tract infection in pregnancy, unspecified trimester: Secondary | ICD-10-CM

## 2014-05-30 DIAGNOSIS — N12 Tubulo-interstitial nephritis, not specified as acute or chronic: Secondary | ICD-10-CM

## 2014-05-30 LAB — CBC
HCT: 29.7 % — ABNORMAL LOW (ref 36.0–46.0)
Hemoglobin: 9.8 g/dL — ABNORMAL LOW (ref 12.0–15.0)
MCH: 29.3 pg (ref 26.0–34.0)
MCHC: 33 g/dL (ref 30.0–36.0)
MCV: 88.7 fL (ref 78.0–100.0)
PLATELETS: 178 10*3/uL (ref 150–400)
RBC: 3.35 MIL/uL — ABNORMAL LOW (ref 3.87–5.11)
RDW: 13.7 % (ref 11.5–15.5)
WBC: 22.2 10*3/uL — ABNORMAL HIGH (ref 4.0–10.5)

## 2014-05-30 LAB — BASIC METABOLIC PANEL
Anion gap: 10 (ref 5–15)
BUN: 7 mg/dL (ref 6–23)
CO2: 22 meq/L (ref 19–32)
CREATININE: 0.52 mg/dL (ref 0.50–1.10)
Calcium: 8.1 mg/dL — ABNORMAL LOW (ref 8.4–10.5)
Chloride: 106 mEq/L (ref 96–112)
GFR calc non Af Amer: >90 mL/min (ref >90)
Glucose, Bld: 98 mg/dL (ref 70–99)
Potassium: 4 mEq/L (ref 3.7–5.3)
Sodium: 138 mEq/L (ref 137–147)

## 2014-05-30 LAB — URINE CULTURE: Colony Count: 30000

## 2014-05-30 MED ORDER — NITROFURANTOIN MONOHYD MACRO 100 MG PO CAPS: 100.0000 mg | ORAL_CAPSULE | Freq: Two times a day (BID) | ORAL | Status: DC

## 2014-05-30 NOTE — H&P (Signed)
Attestation of Attending Supervision of Advanced Practitioner (CNM/NP): Evaluation and management procedures were performed by the Advanced Practitioner under my supervision and collaboration. I have reviewed the Advanced Practitioner's note and chart, and I agree with the management and plan.  Muslima Toppins H. 12:21 PM   

## 2014-05-30 NOTE — Discharge Summary (Signed)
Physician Discharge Summary  Patient ID: Misty Salas MRN: [id number] DOB/AGE: [birth year] 32 y.o.  Admit date: 05/29/2014 Discharge date: 05/30/2014  Admission Diagnoses: 34.[redacted] weeks EGA with pyelonephritis  Discharge Diagnoses: same Active Problems:   Pyelonephritis   Discharged Condition: good  Hospital Course: She was admitted with pyelonephritis and was treated with IV rocephin q 12 hours. Her back pain quickly resolved and her urine went from a thick brown to light yellow with IV and po hydration. She remained afebrile throughout and expressed her desire to go home.  Consults: None  Significant Diagnostic Studies: labs: WBC was 12.  Treatments: IV hydration and antibiotics: ceftriaxone  Discharge Exam: Blood pressure 95/50, pulse 93, temperature 98 F (36.7 C), temperature source Oral, resp. rate 18, height 5' 8.5" (1.74 m), weight 81.375 kg (179 lb 6.4 oz), last menstrual period 10/14/2013, SpO2 98.00%. General appearance: alert Back: symmetric, no curvature. ROM normal. No CVA tenderness. Resp: clear to auscultation bilaterally Cardio: regular rate and rhythm, S1, S2 normal, no murmur, click, rub or gallop GI: soft, non-tender; bowel sounds normal; no masses,  no organomegaly FHR- reactive, no contractions  Disposition: Final discharge disposition not confirmed     Medication List         nitrofurantoin (macrocrystal-monohydrate) 100 MG capsule  Commonly known as:  MACROBID  Take 1 capsule (100 mg total) by mouth 2 (two) times daily.     prenatal multivitamin Tabs tablet  Take 1 tablet by mouth daily at 12 noon.     PROAIR HFA IN  Inhale 2 puffs into the lungs as needed.           Follow-up Information   Please follow up. ([facility]       Signed: Chandell Attridge C. 05/30/2014, 8:49 AM

## 2014-05-30 NOTE — Discharge Instructions (Signed)
Pyelonephritis, Adult Pyelonephritis is a kidney infection. In general, there are 2 main types of pyelonephritis:  Infections that come on quickly without any warning (acute pyelonephritis).  Infections that persist for a long period of time (chronic pyelonephritis). CAUSES  Two main causes of pyelonephritis are:  Bacteria traveling from the bladder to the kidney. This is a problem especially in pregnant women. The urine in the bladder can become filled with bacteria from multiple causes, including:  Inflammation of the prostate gland (prostatitis).  Sexual intercourse in females.  Bladder infection (cystitis).  Bacteria traveling from the bloodstream to the tissue part of the kidney. Problems that may increase your risk of getting a kidney infection include:  Diabetes.  Kidney stones or bladder stones.  Cancer.  Catheters placed in the bladder.  Other abnormalities of the kidney or ureter. SYMPTOMS   Abdominal pain.  Pain in the side or flank area.  Fever.  Chills.  Upset stomach.  Blood in the urine (dark urine).  Frequent urination.  Strong or persistent urge to urinate.  Burning or stinging when urinating. DIAGNOSIS  Your caregiver may diagnose your kidney infection based on your symptoms. A urine sample may also be taken. TREATMENT  In general, treatment depends on how severe the infection is.   If the infection is mild and caught early, your caregiver may treat you with oral antibiotics and send you home.  If the infection is more severe, the bacteria may have gotten into the bloodstream. This will require intravenous (IV) antibiotics and a hospital stay. Symptoms may include:  High fever.  Severe flank pain.  Shaking chills.  Even after a hospital stay, your caregiver may require you to be on oral antibiotics for a period of time.  Other treatments may be required depending upon the cause of the infection. HOME CARE INSTRUCTIONS   Take your  antibiotics as directed. Finish them even if you start to feel better.  Make an appointment to have your urine checked to make sure the infection is gone.  Drink enough fluids to keep your urine clear or pale yellow.  Take medicines for the bladder if you have urgency and frequency of urination as directed by your caregiver. SEEK IMMEDIATE MEDICAL CARE IF:   You have a fever or persistent symptoms for more than 2-3 days.  You have a fever and your symptoms suddenly get worse.  You are unable to take your antibiotics or fluids.  You develop shaking chills.  You experience extreme weakness or fainting.  There is no improvement after 2 days of treatment. MAKE SURE YOU:  Understand these instructions.  Will watch your condition.  Will get help right away if you are not doing well or get worse. Document Released: 10/19/2005 Document Revised: 04/19/2012 Document Reviewed: 03/25/2011 [facility] Patient Information 2015 [place], [place]. This information is not intended to replace advice given to you by your health care provider. Make sure you discuss any questions you have with your health care provider. Fetal Movement Counts Patient Name: __________________________________________________ Patient Due Date: ____________________ Performing a fetal movement count is highly recommended in high-risk pregnancies, but it is good for every pregnant woman to do. Your health care provider may ask you to start counting fetal movements at 28 weeks of the pregnancy. Fetal movements often increase: After eating a full meal. After physical activity. After eating or drinking something sweet or cold. At rest. Pay attention to when you feel the baby is most active. This will help you notice a  pattern of your baby's sleep and wake cycles and what factors contribute to an increase in fetal movement. It is important to perform a fetal movement count at the same time each day when your baby is normally most  active.  HOW TO COUNT FETAL MOVEMENTS Find a quiet and comfortable area to sit or lie down on your left side. Lying on your left side provides the best blood and oxygen circulation to your baby. Write down the day and time on a sheet of paper or in a journal. Start counting kicks, flutters, swishes, rolls, or jabs in a 2-hour period. You should feel at least 10 movements within 2 hours. If you do not feel 10 movements in 2 hours, wait 2-3 hours and count again. Look for a change in the pattern or not enough counts in 2 hours. SEEK MEDICAL CARE IF: You feel less than 10 counts in 2 hours, tried twice. There is no movement in over an hour. The pattern is changing or taking longer each day to reach 10 counts in 2 hours. You feel the baby is not moving as he or she usually does. Date: ____________ Movements: ____________ Start time: ____________ Doreatha MartinFinish time: ____________  Date: ____________ Movements: ____________ Start time: ____________ Doreatha MartinFinish time: ____________ Date: ____________ Movements: ____________ Start time: ____________ Doreatha MartinFinish time: ____________ Date: ____________ Movements: ____________ Start time: ____________ Doreatha MartinFinish time: ____________ Date: ____________ Movements: ____________ Start time: ____________ Doreatha MartinFinish time: ____________ Date: ____________ Movements: ____________ Start time: ____________ Doreatha MartinFinish time: ____________ Date: ____________ Movements: ____________ Start time: ____________ Doreatha MartinFinish time: ____________ Date: ____________ Movements: ____________ Start time: ____________ Doreatha MartinFinish time: ____________  Date: ____________ Movements: ____________ Start time: ____________ Doreatha MartinFinish time: ____________ Date: ____________ Movements: ____________ Start time: ____________ Doreatha MartinFinish time: ____________ Date: ____________ Movements: ____________ Start time: ____________ Doreatha MartinFinish time: ____________ Date: ____________ Movements: ____________ Start time: ____________ Doreatha MartinFinish time: ____________ Date:  ____________ Movements: ____________ Start time: ____________ Doreatha MartinFinish time: ____________ Date: ____________ Movements: ____________ Start time: ____________ Doreatha MartinFinish time: ____________ Date: ____________ Movements: ____________ Start time: ____________ Doreatha MartinFinish time: ____________  Date: ____________ Movements: ____________ Start time: ____________ Doreatha MartinFinish time: ____________ Date: ____________ Movements: ____________ Start time: ____________ Doreatha MartinFinish time: ____________ Date: ____________ Movements: ____________ Start time: ____________ Doreatha MartinFinish time: ____________ Date: ____________ Movements: ____________ Start time: ____________ Doreatha MartinFinish time: ____________ Date: ____________ Movements: ____________ Start time: ____________ Doreatha MartinFinish time: ____________ Date: ____________ Movements: ____________ Start time: ____________ Doreatha MartinFinish time: ____________ Date: ____________ Movements: ____________ Start time: ____________ Doreatha MartinFinish time: ____________  Date: ____________ Movements: ____________ Start time: ____________ Doreatha MartinFinish time: ____________ Date: ____________ Movements: ____________ Start time: ____________ Doreatha MartinFinish time: ____________ Date: ____________ Movements: ____________ Start time: ____________ Doreatha MartinFinish time: ____________ Date: ____________ Movements: ____________ Start time: ____________ Doreatha MartinFinish time: ____________ Date: ____________ Movements: ____________ Start time: ____________ Doreatha MartinFinish time: ____________ Date: ____________ Movements: ____________ Start time: ____________ Doreatha MartinFinish time: ____________ Date: ____________ Movements: ____________ Start time: ____________ Doreatha MartinFinish time: ____________  Date: ____________ Movements: ____________ Start time: ____________ Doreatha MartinFinish time: ____________ Date: ____________ Movements: ____________ Start time: ____________ Doreatha MartinFinish time: ____________ Date: ____________ Movements: ____________ Start time: ____________ Doreatha MartinFinish time: ____________ Date: ____________ Movements: ____________ Start  time: ____________ Doreatha MartinFinish time: ____________ Date: ____________ Movements: ____________ Start time: ____________ Doreatha MartinFinish time: ____________ Date: ____________ Movements: ____________ Start time: ____________ Doreatha MartinFinish time: ____________ Date: ____________ Movements: ____________ Start time: ____________ Doreatha MartinFinish time: ____________  Date: ____________ Movements: ____________ Start time: ____________ Doreatha MartinFinish time: ____________ Date: ____________ Movements: ____________ Start time: ____________ Doreatha MartinFinish time: ____________ Date: ____________ Movements: ____________ Start time: ____________ Doreatha MartinFinish time: ____________ Date: ____________ Movements: ____________ Start time:  ____________ Doreatha Martin time: ____________ Date: ____________ Movements: ____________ Start time: ____________ Doreatha Martin time: ____________ Date: ____________ Movements: ____________ Start time: ____________ Doreatha Martin time: ____________ Date: ____________ Movements: ____________ Start time: ____________ Doreatha Martin time: ____________  Date: ____________ Movements: ____________ Start time: ____________ Doreatha Martin time: ____________ Date: ____________ Movements: ____________ Start time: ____________ Doreatha Martin time: ____________ Date: ____________ Movements: ____________ Start time: ____________ Doreatha Martin time: ____________ Date: ____________ Movements: ____________ Start time: ____________ Doreatha Martin time: ____________ Date: ____________ Movements: ____________ Start time: ____________ Doreatha Martin time: ____________ Date: ____________ Movements: ____________ Start time: ____________ Doreatha Martin time: ____________ Date: ____________ Movements: ____________ Start time: ____________ Doreatha Martin time: ____________  Date: ____________ Movements: ____________ Start time: ____________ Doreatha Martin time: ____________ Date: ____________ Movements: ____________ Start time: ____________ Doreatha Martin time: ____________ Date: ____________ Movements: ____________ Start time: ____________ Doreatha Martin time:  ____________ Date: ____________ Movements: ____________ Start time: ____________ Doreatha Martin time: ____________ Date: ____________ Movements: ____________ Start time: ____________ Doreatha Martin time: ____________ Date: ____________ Movements: ____________ Start time: ____________ Doreatha Martin time: ____________ Document Released: 11/18/2006 Document Revised: 03/05/2014 Document Reviewed: 08/15/2012 [employer] Patient Information 2015 [place] [employer]. This information is not intended to replace advice given to you by your health care provider. Make sure you discuss any questions you have with your health care provider.

## 2014-05-30 NOTE — Progress Notes (Signed)
Ur chart review completed.  

## 2014-06-06 ENCOUNTER — Ambulatory Visit (INDEPENDENT_AMBULATORY_CARE_PROVIDER_SITE_OTHER): Payer: BC Managed Care – PPO | Admitting: Advanced Practice Midwife

## 2014-06-06 VITALS — BP 124/70 | Wt 184.0 lb

## 2014-06-06 DIAGNOSIS — Z3483 Encounter for supervision of other normal pregnancy, third trimester: Secondary | ICD-10-CM

## 2014-06-06 DIAGNOSIS — Z1389 Encounter for screening for other disorder: Secondary | ICD-10-CM

## 2014-06-06 DIAGNOSIS — Z331 Pregnant state, incidental: Secondary | ICD-10-CM

## 2014-06-06 DIAGNOSIS — Z3480 Encounter for supervision of other normal pregnancy, unspecified trimester: Secondary | ICD-10-CM

## 2014-06-06 LAB — POCT URINALYSIS DIPSTICK
BILIRUBIN UA: NEGATIVE
Ketones, UA: NEGATIVE
Leukocytes, UA: NEGATIVE
Nitrite, UA: NEGATIVE
RBC UA: NEGATIVE

## 2014-06-06 NOTE — Progress Notes (Signed)
[id number] Estimated Date of Delivery: 07/05/14  Last menstrual period 10/14/2013.   Pt left here last week with fever/back pain/+ nitrites and [facility].  She quickly improved and was discharged home on a week of Macrobid.  Urine culture did not grow anything.  Feels much better. Will suppress qhs with Macrobid BP weight and urine results all reviewed and noted.  Please refer to the obstetrical flow sheet for the fundal height and fetal heart rate documentation:  Patient reports good fetal movement, denies any bleeding and no rupture of membranes symptoms or regular contractions. Patient is without complaints except normal pregnancy complaints All questions were answered.  Plan:  Continued routine obstetrical care, plan RLTCS at 39 weeks (Dr. Emelda FearFerguson on)  Follow up in 1 weeks for OB appointment, schedule c/s

## 2014-06-15 ENCOUNTER — Encounter: Payer: BC Managed Care – PPO | Admitting: Obstetrics and Gynecology

## 2014-06-18 ENCOUNTER — Ambulatory Visit (INDEPENDENT_AMBULATORY_CARE_PROVIDER_SITE_OTHER): Payer: BC Managed Care – PPO | Admitting: Obstetrics and Gynecology

## 2014-06-18 ENCOUNTER — Encounter: Payer: Self-pay | Admitting: Obstetrics and Gynecology

## 2014-06-18 VITALS — BP 110/68 | Wt 185.0 lb

## 2014-06-18 DIAGNOSIS — Z331 Pregnant state, incidental: Secondary | ICD-10-CM

## 2014-06-18 DIAGNOSIS — Z1159 Encounter for screening for other viral diseases: Secondary | ICD-10-CM

## 2014-06-18 DIAGNOSIS — Z1389 Encounter for screening for other disorder: Secondary | ICD-10-CM

## 2014-06-18 DIAGNOSIS — Z3492 Encounter for supervision of normal pregnancy, unspecified, second trimester: Secondary | ICD-10-CM

## 2014-06-18 DIAGNOSIS — Z3685 Encounter for antenatal screening for Streptococcus B: Secondary | ICD-10-CM

## 2014-06-18 DIAGNOSIS — Z3480 Encounter for supervision of other normal pregnancy, unspecified trimester: Secondary | ICD-10-CM

## 2014-06-18 LAB — POCT URINALYSIS DIPSTICK
Blood, UA: NEGATIVE
Glucose, UA: NEGATIVE
Ketones, UA: NEGATIVE
Leukocytes, UA: NEGATIVE
Nitrite, UA: NEGATIVE

## 2014-06-18 NOTE — Progress Notes (Signed)
This chart was scribed by Carl Bestelina Holson, Medical Scribe, for Dr. Christin BachJohn Demesha Boorman on 06/18/2014 at 12:24 PM. This chart was reviewed by Dr. Christin BachJohn Jailah Willis for accuracy.   [id number] 1713w4d Estimated Date of Delivery: 07/05/14  Blood pressure 110/68, weight 185 lb (83.915 kg), last menstrual period 10/14/2013.   refer to the ob flow sheet for FH and FHR, also BP, Wt, Urine results:  Patient reports good fetal movement, denies any bleeding and no rupture of membranes symptoms or regular contractions.  Pt wants to schedule a C-section.  She had a C-section to deliver her first child after she was breech.  She was 4 cm dilated before she was noted as breech and had to have the C-section. This is her second child.  Patient complaints: severe, painful vaginal vein swelling.  She has had no thrombosis.    Physical exam -  Cervix: soft but still closed Pelvis generous. EFW by leopold 6 lb  Questions were answered. A lengthy discussion of pros and cons of VBAC or RLTcesarean reviewed line by line with pt who will decide this week if she desires Cesarean at 39 wk.  Plan:  Continued routine obstetrical care,  rechk 1wk. Shes  F/u in 1wk.

## 2014-06-18 NOTE — Patient Instructions (Addendum)
Vaginal Birth After Cesarean Delivery Vaginal birth after cesarean delivery (VBAC) is giving birth vaginally after previously delivering a baby by a cesarean. In the past, if a woman had a cesarean delivery, all births afterward would be done by cesarean delivery. This is no longer true. It can be safe for the mother to try a vaginal delivery after having a cesarean delivery.  It is important to discuss VBAC with your health care provider early in the pregnancy so you can understand the risks, benefits, and options. It will give you time to decide what is best in your particular case. The final decision about whether to have a VBAC or repeat cesarean delivery should be between you and your health care provider. Any changes in your health or your baby's health during your pregnancy may make it necessary to change your initial decision about VBAC.  WOMEN WHO PLAN TO HAVE A VBAC SHOULD CHECK WITH THEIR HEALTH CARE PROVIDER TO BE SURE THAT:  The previous cesarean delivery was done with a low transverse uterine cut (incision) (not a vertical classical incision).   The birth canal is big enough for the baby.   There were no other operations on the uterus.   An electronic fetal monitor (EFM) will be on at all times during labor.   An operating room will be available and ready in case an emergency cesarean delivery is needed.   A health care provider and surgical nursing staff will be available at all times during labor to be ready to do an emergency delivery cesarean if necessary.   An anesthesiologist will be present in case an emergency cesarean delivery is needed.   The nursery is prepared and has adequate personnel and necessary equipment available to care for the baby in case of an emergency cesarean delivery. BENEFITS OF VBAC  Shorter stay in the hospital.   Avoidance of risks associated with cesarean delivery, such as:  Surgical complications, such as opening of the incision or  hernia in the incision.  Injury to other organs.  Fever. This can occur if an infection develops after surgery. It can also occur as a reaction to the medicine given to make you numb during the surgery.  Less blood loss and need for blood transfusions.  Lower risk of blood clots and infection.  Shorter recovery.   Decreased risk for having to remove the uterus (hysterectomy).   Decreased risk for the placenta to completely or partially cover the opening of the uterus (placenta previa) with a future pregnancy.   Decrease risk in future labor and delivery. RISKS OF A VBAC  Tearing (rupture) of the uterus. This is occurs in less than 1% of VBACs. The risk of this happening is higher if:  Steps are taken to begin the labor process (induce labor) or stimulate or strengthen contractions (augment labor).   Medicine is used to soften (ripen) the cervix.  Having to remove the uterus (hysterectomy) if it ruptures. VBAC SHOULD NOT BE DONE IF:  The previous cesarean delivery was done with a vertical (classical) or T-shaped incision or you do not know what kind of incision was made.   You had a ruptured uterus.   You have had certain types of surgery on your uterus, such as removal of uterine fibroids. Ask your health care provider about other types of surgeries that prevent you from having a VBAC.  You have certain medical or childbirth (obstetrical) problems.   There are problems with the baby.   You   have had two previous cesarean deliveries and no vaginal deliveries. OTHER FACTS TO KNOW ABOUT VBAC:  It is safe to have an epidural anesthetic with VBAC.   It is safe to turn the baby from a breech position (attempt an external cephalic version).   It is safe to try a VBAC with twins.   VBAC may not be successful if your baby weights 8.8 lb (4 kg) or more. However, weight predictions are not always accurate and should not be used alone to decide if VBAC is right for  you.  There is an increased failure rate if the time between the cesarean delivery and VBAC is less than 19 months.   Your health care provider may advise against a VBAC if you have preeclampsia (high blood pressure, protein in the urine, and swelling of face and extremities).   VBAC is often successful if you previously gave birth vaginally.   VBAC is often successful when the labor starts spontaneously before the due date.   Delivering a baby through a VBAC is similar to having a normal spontaneous vaginal delivery. Document Released: 04/11/2007 Document Revised: 03/05/2014 Document Reviewed: 05/18/2013 [facility] Patient Information 2015 [place], [place]. This information is not intended to replace advice given to you by your health care provider. Make sure you discuss any questions you have with your health care provider. Faculty Practice OB/GYN Attending Consult Note  33 y.o. G3P1011 at [redacted]w[redacted]d with Estimated Date of Delivery: 07/05/14 was seen today in office to discuss trial of labor after cesarean section (TOLAC) versus elective repeat cesarean delivery (ERCD). The following risks were discussed with the patient.  Risk of uterine rupture at term is 0.78 percent with TOLAC and 0.22 percent with ERCD. 1 in 10 uterine ruptures will result in neonatal death or neurological injury. The benefits of a trial of labor after cesarean (TOLAC) resulting in a vaginal birth after cesarean (VBAC) include the following: shorter length of hospital stay and postpartum recovery (in most cases); fewer complications, such as postpartum fever, wound or uterine infection, thromboembolism (blood clots in the leg or lung), need for blood transfusion and fewer neonatal breathing problems. The risks of an attempted VBAC or TOLAC include the following: Risk of failed trial of labor after cesarean (TOLAC) without a vaginal birth after cesarean (VBAC) resulting in repeat cesarean delivery (RCD) in about 20 to 40 percent  of women who attempt VBAC.  Risk of rupture of uterus resulting in an emergency cesarean delivery. The risk of uterine rupture may be related in part to the type of uterine incision made during the first cesarean delivery. A previous transverse uterine incision has the lowest risk of rupture (0.2 to 1.5 percent risk). Vertical or T-shaped uterine incisions have a higher risk of uterine rupture (4 to 9 percent risk)The risk of fetal death is very low with both VBAC and elective repeat cesarean delivery (ERCD), but the likelihood of fetal death is higher with VBAC than with ERCD. Maternal death is very rare with either type of delivery. The risks of an elective repeat cesarean delivery (ERCD) were reviewed with the patient including but not limited to: 12/998 risk of uterine rupture which could have serious consequences, bleeding which may require transfusion; infection which may require antibiotics; injury to bowel, bladder or other surrounding organs (bowel, bladder, ureters); injury to the fetus; need for additional procedures including hysterectomy in the event of a life-threatening hemorrhage; thromboembolic phenomenon; abnormal placentation; incisional problems; death and other postoperative or anesthesia complications.  These risks and benefits are summarized on the consent form, which was reviewed with the patient during the visit.  All her questions answered and she signed a consent indicating a preference for TOLAC/ERCD. A copy of the consent was given to the patient.  (She does say that she would want tubal sterilization in the event a cesarean section is performed due to any maternal-fetal indication.)  (Patient also desires bilateral tubal sterilization (BTS) at the time of RCD, also discussed the additional risks of regret, failure rate of 0.5-1%  with increased risk of ectopic gestation and permanent and irreversible nature of the procedure.  Other forms of reversible BCM discussed, also  discussed vasectomy, patient desires BTS).  [facility]  Tilda BurrowFERGUSON,Clary Meeker V  Attending Obstetrician & Gynecologist Faculty Practice, [facility]

## 2014-06-18 NOTE — Progress Notes (Signed)
Wants to schedule C-section and discuss antibiotics she is taking may need a refill.

## 2014-06-19 ENCOUNTER — Other Ambulatory Visit: Payer: Self-pay | Admitting: Obstetrics and Gynecology

## 2014-06-19 LAB — GC/CHLAMYDIA PROBE AMP
CT PROBE, AMP APTIMA: NEGATIVE
GC Probe RNA: NEGATIVE

## 2014-06-20 LAB — STREP B DNA PROBE: GBSP: NOT DETECTED

## 2014-06-22 ENCOUNTER — Encounter: Payer: Self-pay | Admitting: Obstetrics and Gynecology

## 2014-06-25 ENCOUNTER — Other Ambulatory Visit: Payer: Self-pay | Admitting: Obstetrics and Gynecology

## 2014-06-25 ENCOUNTER — Encounter: Payer: Self-pay | Admitting: Obstetrics and Gynecology

## 2014-06-25 ENCOUNTER — Ambulatory Visit (INDEPENDENT_AMBULATORY_CARE_PROVIDER_SITE_OTHER): Payer: BC Managed Care – PPO | Admitting: Obstetrics and Gynecology

## 2014-06-25 VITALS — BP 132/80 | Wt 185.0 lb

## 2014-06-25 DIAGNOSIS — Z331 Pregnant state, incidental: Secondary | ICD-10-CM

## 2014-06-25 DIAGNOSIS — Z1389 Encounter for screening for other disorder: Secondary | ICD-10-CM

## 2014-06-25 DIAGNOSIS — Z3492 Encounter for supervision of normal pregnancy, unspecified, second trimester: Secondary | ICD-10-CM

## 2014-06-25 DIAGNOSIS — Z029 Encounter for administrative examinations, unspecified: Secondary | ICD-10-CM

## 2014-06-25 DIAGNOSIS — Z3480 Encounter for supervision of other normal pregnancy, unspecified trimester: Secondary | ICD-10-CM

## 2014-06-25 LAB — POCT URINALYSIS DIPSTICK
Blood, UA: NEGATIVE
Glucose, UA: NEGATIVE
KETONES UA: NEGATIVE
Nitrite, UA: NEGATIVE

## 2014-06-25 NOTE — Progress Notes (Signed)
G1W2993 [redacted]w[redacted]d Estimated Date of Delivery: 07/05/14  Blood pressure 132/80, weight 185 lb (83.915 kg), last menstrual period 10/14/2013.   refer to the ob flow sheet for FH and FHR, also BP, Wt, Urine results:notable for trace protein   Patient reports good fetal movement, denies any bleeding and no rupture of membranes symptoms or regular contractions. Patient complaints: No complaints at this time. Patient states her job has a short term disability program which she would like to enroll in. She also would like to have a RLTCS.   Questions were answered. Plan:  Continued routine obstetrical care, RLTCS on 06/28/14 Birth control will be Implanon   F/u in 2 weeks for post op check

## 2014-06-25 NOTE — H&P (Signed)
Misty Salas is a 33 y.o. female presenting for pregnancy at 39 weeks, for repeat  Cesarean section.. She plans to breast feed, and to use nexplanon as contraceptive History OB History   Grav Para Term Preterm Abortions TAB SAB Ect Mult Living   Past Medical History  Diagnosis Date  . Asthma   . Supervision of normal pregnancy in second trimester 01/23/2014   Past Surgical History  Procedure Laterality Date  . Cesarean section    . Wisdom tooth extraction     Family History: family history includes Asthma in her sister; Cancer in her maternal grandmother and paternal uncle; Diabetes in her paternal grandfather and paternal uncle; Heart attack in her maternal grandfather. Social History:  reports that she has never smoked. She has never used smokeless tobacco. She reports that she does not drink alcohol or use illicit drugs.   Prenatal Transfer Tool  Maternal Diabetes: No Genetic Screening: Normal Maternal Ultrasounds/Referrals: Normal Fetal Ultrasounds or other Referrals:  None Maternal Substance Abuse:  No Significant Maternal Medications:  None Significant Maternal Lab Results:  Lab values include: Group B Strep negative Other Comments:  None  ROS    Last menstrual period 10/14/2013. Exam Physical Exam  Prenatal labs: ABO, Rh: O/Positive/-- (02/20 0000) Antibody: NEG (06/17 0908) Rubella: Immune (02/20 0000) RPR: NON REAC (06/17 0908)  HBsAg: Negative (02/20 0000)  HIV: NONREACTIVE (06/17 0908)  GBS: NOT DETECTED (08/17 1449)   Assessment/Plan: Pregnancy 39 wk, prior cesarean not desiring TOLAC Plan 1. Repeat low transverse cesarean 06/28/14  BC- nexplanon            Bottle feeding .     2. Scheduled for 9:30 am on this Thursday.  Misty Salas V 06/25/2014, 3:04 PM

## 2014-06-26 NOTE — H&P (Signed)
Misty Salas is a 33 y.o. female presenting for repeat cesarean section. She has had one prior cesarean, and was offered TOLAC, but after counsel and consideration of options, pt has decided to proceed with repeat cesarean. No current plans for permanent contracteption at this time. She plans Nexplanon., and to breast feed. History OB History   Grav Para Term Preterm Abortions TAB SAB Ect Mult Living   Past Medical History  Diagnosis Date  . Asthma   . Supervision of normal pregnancy in second trimester 01/23/2014   Past Surgical History  Procedure Laterality Date  . Cesarean section    . Wisdom tooth extraction     Family History: family history includes Asthma in her sister; Cancer in her maternal grandmother and paternal uncle; Diabetes in her paternal grandfather and paternal uncle; Heart attack in her maternal grandfather. Social History:  reports that she has never smoked. She has never used smokeless tobacco. She reports that she does not drink alcohol or use illicit drugs.   Prenatal Transfer Tool  Maternal Diabetes: No Genetic Screening: Normal Maternal Ultrasounds/Referrals: Normal Fetal Ultrasounds or other Referrals:  None Maternal Substance Abuse:  No Significant Maternal Medications:  None Significant Maternal Lab Results:  Lab values include: Group B Strep negative Other Comments:  None  ROS    Last menstrual period 10/14/2013. Exam Physical Exam  Constitutional: She appears well-nourished.  HENT:  Head: Normocephalic and atraumatic.  Eyes: Pupils are equal, round, and reactive to light.  Neck: Normal range of motion. Neck supple. No thyromegaly present.  Cardiovascular: Normal rate and regular rhythm.   Respiratory: Effort normal.  GI: Soft. There is no tenderness.  Gravid uterus c/w dates.   prior lower abdominal incision, transverse. Prenatal labs: ABO, Rh: O/Positive/-- (02/20 0000) Antibody: NEG (06/17 0908) Rubella: Immune  (02/20 0000) RPR: NON REAC (06/17 0908)  HBsAg: Negative (02/20 0000)  HIV: NONREACTIVE (06/17 0908)  GBS: NOT DETECTED (08/17 1449)   Assessment/Plan: Pregnancy 39 weeks, prior cesarean x 1 , declining TOLAC. Plan: Repeat cesarean section 06/28/14 at 9:30 am.   [name] 06/26/2014, 11:09 AM

## 2014-06-27 ENCOUNTER — Encounter (HOSPITAL_COMMUNITY)
Admission: RE | Admit: 2014-06-27 | Discharge: 2014-06-27 | Disposition: A | Discharge status: Home / Self Care | Payer: BC Managed Care – PPO | Source: Ambulatory Visit | Attending: Obstetrics and Gynecology | Admitting: Obstetrics and Gynecology

## 2014-06-27 ENCOUNTER — Encounter (HOSPITAL_COMMUNITY): Payer: Self-pay

## 2014-06-27 LAB — URINALYSIS, ROUTINE W REFLEX MICROSCOPIC
Bilirubin Urine: NEGATIVE
Glucose, UA: NEGATIVE mg/dL
HGB URINE DIPSTICK: NEGATIVE
Ketones, ur: NEGATIVE mg/dL
Nitrite: NEGATIVE
Protein, ur: NEGATIVE mg/dL
SPECIFIC GRAVITY, URINE: 1.015 (ref 1.005–1.030)
UROBILINOGEN UA: 0.2 mg/dL (ref 0.0–1.0)
pH: 6 (ref 5.0–8.0)

## 2014-06-27 LAB — ABO/RH: ABO/RH(D): O POS

## 2014-06-27 LAB — TYPE AND SCREEN
ABO/RH(D): O POS
Antibody Screen: NEGATIVE

## 2014-06-27 LAB — URINE MICROSCOPIC-ADD ON

## 2014-06-27 LAB — CBC
HCT: 35.4 % — ABNORMAL LOW (ref 36.0–46.0)
HEMOGLOBIN: 12 g/dL (ref 12.0–15.0)
MCH: 29.2 pg (ref 26.0–34.0)
MCHC: 33.9 g/dL (ref 30.0–36.0)
MCV: 86.1 fL (ref 78.0–100.0)
Platelets: 204 10*3/uL (ref 150–400)
RBC: 4.11 MIL/uL (ref 3.87–5.11)
RDW: 13.7 % (ref 11.5–15.5)
WBC: 8.6 10*3/uL (ref 4.0–10.5)

## 2014-06-27 NOTE — Patient Instructions (Signed)
Your procedure is scheduled on: Tomorrow, Aug. 27, 2015  Enter through the [employer] [facility] at: 8:00am  Pick up the phone at the desk and dial [phone].  Call this number if you have problems the morning of surgery: [phone].  Remember: Do NOT eat food:  After midnight tonight Do NOT drink clear liquids after: After midnight tonight  Take these medicines the morning of surgery with a SIP OF WATER: NONE  Do NOT wear jewelry (body piercing), metal hair clips/bobby pins, or nail polish. Do NOT wear lotions, powders, or perfumes.  You may wear deoderant. Do NOT shave for 48 hours prior to surgery. Do NOT bring valuables to the hospital. Leave suitcase in car.  After surgery it may be brought to your room.  For patients admitted to the hospital, checkout time is 11:00 AM the day of discharge.

## 2014-06-28 ENCOUNTER — Encounter (HOSPITAL_COMMUNITY)
Admission: RE | Disposition: A | Discharge status: Home / Self Care | Payer: Self-pay | Source: Ambulatory Visit | Attending: Obstetrics and Gynecology

## 2014-06-28 ENCOUNTER — Inpatient Hospital Stay (HOSPITAL_COMMUNITY)
Admission: RE | Admit: 2014-06-28 | Discharge: 2014-06-30 | DRG: 766 | Disposition: A | Discharge status: Home / Self Care | Payer: BC Managed Care – PPO | Source: Ambulatory Visit | Attending: Obstetrics and Gynecology | Admitting: Obstetrics and Gynecology

## 2014-06-28 ENCOUNTER — Encounter (HOSPITAL_COMMUNITY): Payer: BC Managed Care – PPO | Admitting: Anesthesiology

## 2014-06-28 ENCOUNTER — Encounter (HOSPITAL_COMMUNITY): Payer: Self-pay | Admitting: *Deleted

## 2014-06-28 ENCOUNTER — Inpatient Hospital Stay (HOSPITAL_COMMUNITY): Payer: BC Managed Care – PPO | Admitting: Anesthesiology

## 2014-06-28 DIAGNOSIS — O9989 Other specified diseases and conditions complicating pregnancy, childbirth and the puerperium: Secondary | ICD-10-CM

## 2014-06-28 DIAGNOSIS — Z98891 History of uterine scar from previous surgery: Secondary | ICD-10-CM

## 2014-06-28 DIAGNOSIS — Z833 Family history of diabetes mellitus: Secondary | ICD-10-CM

## 2014-06-28 DIAGNOSIS — Z825 Family history of asthma and other chronic lower respiratory diseases: Secondary | ICD-10-CM | POA: Diagnosis not present

## 2014-06-28 DIAGNOSIS — Z8249 Family history of ischemic heart disease and other diseases of the circulatory system: Secondary | ICD-10-CM | POA: Diagnosis not present

## 2014-06-28 DIAGNOSIS — O34219 Maternal care for unspecified type scar from previous cesarean delivery: Secondary | ICD-10-CM | POA: Diagnosis present

## 2014-06-28 DIAGNOSIS — O99892 Other specified diseases and conditions complicating childbirth: Secondary | ICD-10-CM | POA: Diagnosis present

## 2014-06-28 DIAGNOSIS — J45909 Unspecified asthma, uncomplicated: Secondary | ICD-10-CM | POA: Diagnosis present

## 2014-06-28 LAB — RPR

## 2014-06-28 SURGERY — Surgical Case
Anesthesia: Spinal | Site: Abdomen

## 2014-06-28 MED ORDER — NALBUPHINE HCL 10 MG/ML IJ SOLN: 5.0000 mg | INTRAMUSCULAR | Status: DC | PRN

## 2014-06-28 MED ORDER — NALOXONE HCL 0.4 MG/ML IJ SOLN: 0.4000 mg | INTRAMUSCULAR | Status: DC | PRN

## 2014-06-28 MED ORDER — FENTANYL CITRATE 0.05 MG/ML IJ SOLN
INTRAMUSCULAR | Status: DC | PRN
  Administered 2014-06-28: 25 ug via INTRATHECAL

## 2014-06-28 MED ORDER — 0.9 % SODIUM CHLORIDE (POUR BTL) OPTIME
TOPICAL | Status: DC | PRN
  Administered 2014-06-28: 1000 mL

## 2014-06-28 MED ORDER — SIMETHICONE 80 MG PO CHEW: 80.0000 mg | CHEWABLE_TABLET | ORAL | Status: DC | PRN

## 2014-06-28 MED ORDER — MORPHINE SULFATE 0.5 MG/ML IJ SOLN
INTRAMUSCULAR | Status: AC
Start: 2014-06-28 — End: 2014-06-28
  Filled 2014-06-28: qty 10

## 2014-06-28 MED ORDER — FENTANYL CITRATE 0.05 MG/ML IJ SOLN: 25.0000 ug | INTRAMUSCULAR | Status: DC | PRN

## 2014-06-28 MED ORDER — ACETAMINOPHEN 160 MG/5ML PO SOLN: 975.0000 mg | Freq: Once | ORAL | Status: AC | PRN

## 2014-06-28 MED ORDER — ONDANSETRON HCL 4 MG/2ML IJ SOLN: 4.0000 mg | Freq: Three times a day (TID) | INTRAMUSCULAR | Status: DC | PRN

## 2014-06-28 MED ORDER — ACETAMINOPHEN 160 MG/5ML PO SOLN
975.0000 mg | Freq: Four times a day (QID) | ORAL | Status: DC | PRN
Start: 2014-06-28 — End: 2014-06-30

## 2014-06-28 MED ORDER — OXYTOCIN 40 UNITS IN LACTATED RINGERS INFUSION - SIMPLE MED: 62.5000 mL/h | INTRAVENOUS | Status: AC

## 2014-06-28 MED ORDER — SODIUM CHLORIDE 0.9 % IJ SOLN
3.0000 mL | INTRAMUSCULAR | Status: DC | PRN
Start: 2014-06-28 — End: 2014-06-28

## 2014-06-28 MED ORDER — ONDANSETRON HCL 4 MG PO TABS: 4.0000 mg | ORAL_TABLET | ORAL | Status: DC | PRN

## 2014-06-28 MED ORDER — SCOPOLAMINE 1 MG/3DAYS TD PT72
1.0000 | MEDICATED_PATCH | Freq: Once | TRANSDERMAL | Status: DC
  Administered 2014-06-28: 1.5 mg via TRANSDERMAL

## 2014-06-28 MED ORDER — ALBUTEROL SULFATE (2.5 MG/3ML) 0.083% IN NEBU
2.5000 mg | INHALATION_SOLUTION | Freq: Four times a day (QID) | RESPIRATORY_TRACT | Status: DC | PRN
  Administered 2014-06-28 – 2014-06-29 (×3): 2.5 mg via RESPIRATORY_TRACT
  Filled 2014-06-28 (×3): qty 3

## 2014-06-28 MED ORDER — DIPHENHYDRAMINE HCL 25 MG PO CAPS: 25.0000 mg | ORAL_CAPSULE | Freq: Four times a day (QID) | ORAL | Status: DC | PRN

## 2014-06-28 MED ORDER — PHENYLEPHRINE 40 MCG/ML (10ML) SYRINGE FOR IV PUSH (FOR BLOOD PRESSURE SUPPORT)
PREFILLED_SYRINGE | INTRAVENOUS | Status: AC
  Filled 2014-06-28: qty 5

## 2014-06-28 MED ORDER — DIPHENHYDRAMINE HCL 25 MG PO CAPS: 25.0000 mg | ORAL_CAPSULE | ORAL | Status: DC | PRN

## 2014-06-28 MED ORDER — KETOROLAC TROMETHAMINE 30 MG/ML IJ SOLN
30.0000 mg | Freq: Four times a day (QID) | INTRAMUSCULAR | Status: AC | PRN
  Administered 2014-06-28: 30 mg via INTRAVENOUS
  Filled 2014-06-28: qty 1

## 2014-06-28 MED ORDER — KETOROLAC TROMETHAMINE 30 MG/ML IJ SOLN
INTRAMUSCULAR | Status: AC
  Administered 2014-06-28: 30 mg via INTRAMUSCULAR
  Filled 2014-06-28: qty 1

## 2014-06-28 MED ORDER — PHENYLEPHRINE 8 MG IN D5W 100 ML (0.08MG/ML) PREMIX OPTIME
INJECTION | INTRAVENOUS | Status: AC
  Filled 2014-06-28: qty 100

## 2014-06-28 MED ORDER — PHENYLEPHRINE 8 MG IN D5W 100 ML (0.08MG/ML) PREMIX OPTIME
INJECTION | INTRAVENOUS | Status: DC | PRN
  Administered 2014-06-28: 60 ug/min via INTRAVENOUS

## 2014-06-28 MED ORDER — SENNOSIDES-DOCUSATE SODIUM 8.6-50 MG PO TABS
2.0000 | ORAL_TABLET | ORAL | Status: DC
Start: 2014-06-29 — End: 2014-06-30
  Administered 2014-06-28 – 2014-06-30 (×2): 2 via ORAL
  Filled 2014-06-28 (×2): qty 2

## 2014-06-28 MED ORDER — SCOPOLAMINE 1 MG/3DAYS TD PT72
1.0000 | MEDICATED_PATCH | Freq: Once | TRANSDERMAL | Status: DC
  Filled 2014-06-28: qty 1

## 2014-06-28 MED ORDER — ONDANSETRON HCL 4 MG/2ML IJ SOLN
INTRAMUSCULAR | Status: DC | PRN
  Administered 2014-06-28: 4 mg via INTRAVENOUS

## 2014-06-28 MED ORDER — DIPHENHYDRAMINE HCL 50 MG/ML IJ SOLN: 12.5000 mg | INTRAMUSCULAR | Status: DC | PRN

## 2014-06-28 MED ORDER — ALBUTEROL SULFATE HFA 108 (90 BASE) MCG/ACT IN AERS
3.0000 | INHALATION_SPRAY | Freq: Once | RESPIRATORY_TRACT | Status: AC
  Administered 2014-06-28: 3 via RESPIRATORY_TRACT

## 2014-06-28 MED ORDER — SCOPOLAMINE 1 MG/3DAYS TD PT72: 1.0000 | MEDICATED_PATCH | Freq: Once | TRANSDERMAL | Status: DC

## 2014-06-28 MED ORDER — ZOLPIDEM TARTRATE 5 MG PO TABS: 5.0000 mg | ORAL_TABLET | Freq: Every evening | ORAL | Status: DC | PRN

## 2014-06-28 MED ORDER — TETANUS-DIPHTH-ACELL PERTUSSIS 5-2.5-18.5 LF-MCG/0.5 IM SUSP
0.5000 mL | Freq: Once | INTRAMUSCULAR | Status: AC
  Administered 2014-06-30: 0.5 mL via INTRAMUSCULAR
  Filled 2014-06-28: qty 0.5

## 2014-06-28 MED ORDER — LACTATED RINGERS IV SOLN
INTRAVENOUS | Status: DC
  Administered 2014-06-28: 21:00:00 via INTRAVENOUS

## 2014-06-28 MED ORDER — PROMETHAZINE HCL 25 MG/ML IJ SOLN: 6.2500 mg | INTRAMUSCULAR | Status: DC | PRN

## 2014-06-28 MED ORDER — FENTANYL CITRATE 0.05 MG/ML IJ SOLN
INTRAMUSCULAR | Status: AC
  Administered 2014-06-28: 50 ug via INTRAVENOUS
  Filled 2014-06-28: qty 2

## 2014-06-28 MED ORDER — MORPHINE SULFATE (PF) 0.5 MG/ML IJ SOLN
INTRAMUSCULAR | Status: DC | PRN
  Administered 2014-06-28: .15 mg via INTRATHECAL

## 2014-06-28 MED ORDER — OXYTOCIN 40 UNITS IN LACTATED RINGERS INFUSION - SIMPLE MED
INTRAVENOUS | Status: DC | PRN
  Administered 2014-06-28: 40 [IU] via INTRAVENOUS

## 2014-06-28 MED ORDER — ONDANSETRON HCL 4 MG/2ML IJ SOLN
INTRAMUSCULAR | Status: AC
  Filled 2014-06-28: qty 2

## 2014-06-28 MED ORDER — MEPERIDINE HCL 25 MG/ML IJ SOLN: 6.2500 mg | INTRAMUSCULAR | Status: DC | PRN

## 2014-06-28 MED ORDER — SIMETHICONE 80 MG PO CHEW
80.0000 mg | CHEWABLE_TABLET | Freq: Three times a day (TID) | ORAL | Status: DC
  Administered 2014-06-29 – 2014-06-30 (×4): 80 mg via ORAL
  Filled 2014-06-28 (×4): qty 1

## 2014-06-28 MED ORDER — SIMETHICONE 80 MG PO CHEW
80.0000 mg | CHEWABLE_TABLET | ORAL | Status: DC
  Administered 2014-06-28 – 2014-06-30 (×2): 80 mg via ORAL
  Filled 2014-06-28 (×2): qty 1

## 2014-06-28 MED ORDER — DIPHENHYDRAMINE HCL 50 MG/ML IJ SOLN: 25.0000 mg | INTRAMUSCULAR | Status: DC | PRN

## 2014-06-28 MED ORDER — KETOROLAC TROMETHAMINE 30 MG/ML IJ SOLN
30.0000 mg | Freq: Four times a day (QID) | INTRAMUSCULAR | Status: AC | PRN
  Administered 2014-06-28: 30 mg via INTRAMUSCULAR

## 2014-06-28 MED ORDER — LACTATED RINGERS IV SOLN
INTRAVENOUS | Status: DC
  Administered 2014-06-28 (×2): via INTRAVENOUS

## 2014-06-28 MED ORDER — DIBUCAINE 1 % RE OINT: 1.0000 "application " | TOPICAL_OINTMENT | RECTAL | Status: DC | PRN

## 2014-06-28 MED ORDER — CEFAZOLIN SODIUM-DEXTROSE 2-3 GM-% IV SOLR
2.0000 g | INTRAVENOUS | Status: AC
  Administered 2014-06-28: 2 g via INTRAVENOUS

## 2014-06-28 MED ORDER — LANOLIN HYDROUS EX OINT: 1.0000 "application " | TOPICAL_OINTMENT | CUTANEOUS | Status: DC | PRN

## 2014-06-28 MED ORDER — SCOPOLAMINE 1 MG/3DAYS TD PT72
MEDICATED_PATCH | TRANSDERMAL | Status: AC
  Filled 2014-06-28: qty 1

## 2014-06-28 MED ORDER — WITCH HAZEL-GLYCERIN EX PADS: 1.0000 "application " | MEDICATED_PAD | CUTANEOUS | Status: DC | PRN

## 2014-06-28 MED ORDER — CEFAZOLIN SODIUM-DEXTROSE 2-3 GM-% IV SOLR
INTRAVENOUS | Status: AC
  Filled 2014-06-28: qty 50

## 2014-06-28 MED ORDER — OXYTOCIN 10 UNIT/ML IJ SOLN
INTRAMUSCULAR | Status: AC
  Filled 2014-06-28: qty 4

## 2014-06-28 MED ORDER — OXYCODONE-ACETAMINOPHEN 5-325 MG PO TABS
1.0000 | ORAL_TABLET | ORAL | Status: DC | PRN
  Administered 2014-06-29 (×3): 2 via ORAL
  Filled 2014-06-28 (×3): qty 2

## 2014-06-28 MED ORDER — METOCLOPRAMIDE HCL 5 MG/ML IJ SOLN: 10.0000 mg | Freq: Three times a day (TID) | INTRAMUSCULAR | Status: DC | PRN

## 2014-06-28 MED ORDER — PRENATAL MULTIVITAMIN CH
1.0000 | ORAL_TABLET | Freq: Every day | ORAL | Status: DC
  Administered 2014-06-29 – 2014-06-30 (×2): 1 via ORAL
  Filled 2014-06-28 (×2): qty 1

## 2014-06-28 MED ORDER — SODIUM CHLORIDE 0.9 % IJ SOLN: 3.0000 mL | INTRAMUSCULAR | Status: DC | PRN

## 2014-06-28 MED ORDER — NALOXONE HCL 1 MG/ML IJ SOLN: 1.0000 ug/kg/h | INTRAVENOUS | Status: DC | PRN

## 2014-06-28 MED ORDER — LACTATED RINGERS IV SOLN
INTRAVENOUS | Status: DC | PRN
  Administered 2014-06-28: 10:00:00 via INTRAVENOUS

## 2014-06-28 MED ORDER — KETOROLAC TROMETHAMINE 30 MG/ML IJ SOLN: 30.0000 mg | Freq: Four times a day (QID) | INTRAMUSCULAR | Status: DC | PRN

## 2014-06-28 MED ORDER — ALBUTEROL SULFATE HFA 108 (90 BASE) MCG/ACT IN AERS
INHALATION_SPRAY | RESPIRATORY_TRACT | Status: AC
  Administered 2014-06-28: 09:00:00
  Filled 2014-06-28: qty 6.7

## 2014-06-28 MED ORDER — DEXTROSE 5 % IV SOLN
1.0000 ug/kg/h | INTRAVENOUS | Status: DC | PRN
  Filled 2014-06-28: qty 2

## 2014-06-28 MED ORDER — MENTHOL 3 MG MT LOZG: 1.0000 | LOZENGE | OROMUCOSAL | Status: DC | PRN

## 2014-06-28 MED ORDER — FENTANYL CITRATE 0.05 MG/ML IJ SOLN
25.0000 ug | INTRAMUSCULAR | Status: DC | PRN
  Administered 2014-06-28: 50 ug via INTRAVENOUS
  Administered 2014-06-28: 25 ug via INTRAVENOUS

## 2014-06-28 MED ORDER — ONDANSETRON HCL 4 MG/2ML IJ SOLN: 4.0000 mg | INTRAMUSCULAR | Status: DC | PRN

## 2014-06-28 MED ORDER — FENTANYL CITRATE 0.05 MG/ML IJ SOLN
INTRAMUSCULAR | Status: AC
  Filled 2014-06-28: qty 2

## 2014-06-28 MED ORDER — IBUPROFEN 600 MG PO TABS
600.0000 mg | ORAL_TABLET | Freq: Four times a day (QID) | ORAL | Status: DC
Start: 2014-06-28 — End: 2014-06-30
  Administered 2014-06-28 – 2014-06-30 (×7): 600 mg via ORAL
  Filled 2014-06-28 (×7): qty 1

## 2014-06-28 SURGICAL SUPPLY — 36 items
BENZOIN TINCTURE PRP APPL 2/3 (GAUZE/BANDAGES/DRESSINGS) ×3 IMPLANT
BLADE SURG 10 STRL SS (BLADE) ×6 IMPLANT
CLAMP CORD UMBIL (MISCELLANEOUS) ×3 IMPLANT
CLOSURE WOUND 1/2 X4 (GAUZE/BANDAGES/DRESSINGS) ×2
CLOTH BEACON ORANGE TIMEOUT ST (SAFETY) ×3 IMPLANT
COVER LIGHT HANDLE  1/PK (MISCELLANEOUS) ×2
COVER LIGHT HANDLE 1/PK (MISCELLANEOUS) ×1 IMPLANT
DRAPE LG THREE QUARTER DISP (DRAPES) ×3 IMPLANT
DRSG OPSITE POSTOP 4X10 (GAUZE/BANDAGES/DRESSINGS) ×3 IMPLANT
DURAPREP 26ML APPLICATOR (WOUND CARE) ×3 IMPLANT
ELECT REM PT RETURN 9FT ADLT (ELECTROSURGICAL) ×3
ELECTRODE REM PT RTRN 9FT ADLT (ELECTROSURGICAL) ×1 IMPLANT
EXTRACTOR VACUUM KIWI (MISCELLANEOUS) IMPLANT
GLOVE BIO SURGEON ST LM GN SZ9 (GLOVE) ×3 IMPLANT
GLOVE BIOGEL PI IND STRL 9 (GLOVE) ×1 IMPLANT
GLOVE BIOGEL PI INDICATOR 9 (GLOVE) ×2
GOWN STRL REUS W/TWL 2XL LVL3 (GOWN DISPOSABLE) ×3 IMPLANT
GOWN STRL REUS W/TWL LRG LVL3 (GOWN DISPOSABLE) ×3 IMPLANT
NEEDLE HYPO 25X5/8 SAFETYGLIDE (NEEDLE) IMPLANT
NS IRRIG 1000ML POUR BTL (IV SOLUTION) ×3 IMPLANT
PACK C SECTION WH (CUSTOM PROCEDURE TRAY) ×3 IMPLANT
PAD OB MATERNITY 4.3X12.25 (PERSONAL CARE ITEMS) ×3 IMPLANT
RTRCTR C-SECT PINK 25CM LRG (MISCELLANEOUS) ×3 IMPLANT
RTRCTR C-SECT PINK 34CM XLRG (MISCELLANEOUS) IMPLANT
STRIP CLOSURE SKIN 1/2X4 (GAUZE/BANDAGES/DRESSINGS) ×4 IMPLANT
SUT MNCRL 0 VIOLET CTX 36 (SUTURE) ×2 IMPLANT
SUT MONOCRYL 0 CTX 36 (SUTURE) ×4
SUT VIC AB 0 CT1 27 (SUTURE) ×4
SUT VIC AB 0 CT1 27XBRD ANBCTR (SUTURE) ×2 IMPLANT
SUT VIC AB 2-0 CT1 27 (SUTURE) ×2
SUT VIC AB 2-0 CT1 TAPERPNT 27 (SUTURE) ×1 IMPLANT
SUT VIC AB 4-0 KS 27 (SUTURE) ×3 IMPLANT
SYR BULB IRRIGATION 50ML (SYRINGE) ×3 IMPLANT
TOWEL OR 17X24 6PK STRL BLUE (TOWEL DISPOSABLE) ×3 IMPLANT
TRAY FOLEY CATH 14FR (SET/KITS/TRAYS/PACK) ×3 IMPLANT
WATER STERILE IRR 1000ML POUR (IV SOLUTION) IMPLANT

## 2014-06-28 NOTE — Anesthesia Procedure Notes (Signed)

## 2014-06-28 NOTE — Interval H&P Note (Signed)
History and Physical Interval Note:  06/28/2014 9:24 AM  Misty Salas  has presented today for surgery, with the diagnosis of previous c-section  The various methods of treatment have been discussed with the patient and family. After consideration of risks, benefits and other options for treatment, the patient has consented to  Procedure(s): CESAREAN SECTION REPEAT (N/A) as a surgical intervention .  The patient's history has been reviewed, patient examined, no change in status, stable for surgery.  I have reviewed the patient's chart and labs.  Questions were answered to the patient's satisfaction.     [name]

## 2014-06-28 NOTE — Anesthesia Postprocedure Evaluation (Signed)
  Anesthesia Post-op Note  Patient: Misty Salas  Procedure(s) Performed: Procedure(s): CESAREAN SECTION REPEAT (N/A)  Patient is awake, responsive, moving her legs, and has signs of resolution of her numbness. Pain and nausea are reasonably well controlled. Vital signs are stable and clinically acceptable. Oxygen saturation is clinically acceptable. There are no apparent anesthetic complications at this time. Patient is ready for discharge.

## 2014-06-28 NOTE — Op Note (Signed)
Misty Salas PROCEDURE DATE: 06/28/2014  PREOPERATIVE DIAGNOSES: Intrauterine pregnancy at [redacted]w[redacted]d weeks gestation; previous cesarean section  POSTOPERATIVE DIAGNOSES: The same  PROCEDURE: Repeat Low Transverse Cesarean Section  SURGEON:  Dr. Christin Bach  ASSISTANT:  Dr. Fredirick Lathe  INDICATIONS: Misty Salas is a 33 y.o. [id number] at [redacted]w[redacted]d here for cesarean section secondary to the indications listed under preoperative diagnoses; please see preoperative note for further details.  The risks of cesarean section were discussed with the patient including but were not limited to: bleeding which may require transfusion or reoperation; infection which may require antibiotics; injury to bowel, bladder, ureters or other surrounding organs; injury to the fetus; need for additional procedures including hysterectomy in the event of a life-threatening hemorrhage; placental abnormalities wth subsequent pregnancies, incisional problems, thromboembolic phenomenon and other postoperative/anesthesia complications.   The patient concurred with the proposed plan, giving informed written consent for the procedure.    FINDINGS:  Viable female infant in cephalic presentation.  Apgars 9 and 9.  Clear amniotic fluid.  Intact placenta, three vessel cord.  Normal uterus, fallopian tubes and ovaries bilaterally.  ANESTHESIA: Spinal INTRAVENOUS FLUIDS: 2000 ml ESTIMATED BLOOD LOSS: 500 ml URINE OUTPUT:  150 ml SPECIMENS: Placenta sent to L&D COMPLICATIONS: None immediate  PROCEDURE IN DETAIL:  The patient preoperatively received intravenous antibiotics and had sequential compression devices applied to her lower extremities.  She was then taken to the operating room where spinal anesthesia was administered and was found to be adequate. She was then placed in a dorsal supine position with a leftward tilt, and prepped and draped in a sterile manner.  A foley catheter was placed into her bladder and attached to constant  gravity.  After an adequate timeout was performed, a Pfannenstiel skin incision was made with scalpel, bovi used to cut through to the underlying layer of fascia. The fascia was incised in the midline, and this incision was extended bilaterally using the Mayo scissors.  Kocher clamps were applied to the superior aspect of the fascial incision and the underlying rectus muscles were dissected off bluntly. A similar process was carried out on the inferior aspect of the fascial incision. The rectus muscles were separated in the midline bluntly and the peritoneum was entered bluntly. Attention was turned to the lower uterine segment where a low transverse hysterotomy was made with a scalpel and extended bilaterally bluntly.  The infant was successfully delivered, the cord was clamped and cut and the infant was handed over to awaiting neonatology team. Uterine massage was then administered, and the placenta delivered intact with a three-vessel cord. The uterus was then cleared of clot and debris.  The hysterotomy was closed with 0  monocryl in a running locked fashion, and an imbricating layer was also placed with 0 monocryl. The pelvis was cleared of all clot and debris. Hemostasis was confirmed on all surfaces.  The peritoneum and the muscles were reapproximated using 0 Vicryl running sutures. The fascia was then closed using 0 Vicryl in a running fashion.  The subcutaneous layer was irrigated, then reapproximated with 2-0 plain gut interrupted stitches. The skin was closed with a 4-0 Vicryl subcuticular stitch. The patient tolerated the procedure well. Sponge, lap, instrument and needle counts were correct x 2.  She was taken to the recovery room in stable condition.   Perry Mount, MD 10:51 AM

## 2014-06-28 NOTE — Transfer of Care (Signed)
Immediate Anesthesia Transfer of Care Note  Patient: Misty Salas  Procedure(s) Performed: Procedure(s): CESAREAN SECTION REPEAT (N/A)  Patient Location: PACU  Anesthesia Type:Spinal  Level of Consciousness: awake, alert  and oriented  Airway & Oxygen Therapy: Patient Spontanous Breathing  Post-op Assessment: Report given to PACU RN and Post -op Vital signs reviewed and stable  Post vital signs: Reviewed and stable  Complications: No apparent anesthesia complications

## 2014-06-28 NOTE — Anesthesia Postprocedure Evaluation (Signed)
  Anesthesia Post-op Note  Patient: Misty Salas  Procedure(s) Performed: Procedure(s): CESAREAN SECTION REPEAT (N/A)  Patient Location: PACU and Mother/Baby  Anesthesia Type:Spinal  Level of Consciousness: awake, alert , oriented and patient cooperative  Airway and Oxygen Therapy: Patient Spontanous Breathing  Post-op Pain: none  Post-op Assessment: Post-op Vital signs reviewed, Patient's Cardiovascular Status Stable, Respiratory Function Stable, Patent Airway, No signs of Nausea or vomiting, Adequate PO intake, Pain level controlled, No headache, No backache, No residual numbness and No residual motor weakness  Post-op Vital Signs: Reviewed and stable  Last Vitals:  Filed Vitals:   06/28/14 1620  BP:   Pulse:   Temp:   Resp: 18    Complications: No apparent anesthesia complications

## 2014-06-28 NOTE — Lactation Note (Signed)
This note was copied from the chart of Misty Tashunda Vandezande. Lactation Consultation Note  Patient Name: Misty Salas [id number] Date: 06/28/2014 Reason for consult: Initial assessment of this experienced second-time breastfeeding mother and her newborn at 10 hours postpartum.  Baby had initial LATCH score=8 and has had one more feeding since that initial feeding. Mom breastfed her 32 yo daughter for >15 months. LC reviewed hand expression, cue feeding and STS.   Mom encouraged to feed baby 8-12 times/24 hours and with feeding cues. LC encouraged review of Baby and Me pp 9, 14 and 20-25 for STS and BF information. LC provided [employer] brochure and reviewed [facility] services and list of community and web site resources.    Maternal Data Formula Feeding for Exclusion: No Has patient been taught Hand Expression?: Yes (experienced mom; LC reviewed technique) Does the patient have breastfeeding experience prior to this delivery?: Yes  Feeding Length of feed: 15 min  LATCH Score/Interventions            Initial LATCH score=8 per RN assessment          Lactation Tools Discussed/Used   STS, cue feedings, hand expression  Consult Status Consult Status: Follow-up Date: 06/29/14 Follow-up type: In-patient    Warrick Parisian [facility] 06/28/2014, 9:07 PM

## 2014-06-28 NOTE — Addendum Note (Signed)
Addendum created 06/28/14 1740 by Orlie Pollen, CRNA   Modules edited: Notes Section   Notes Section:  File: [id number]

## 2014-06-28 NOTE — Anesthesia Preprocedure Evaluation (Signed)
Anesthesia Evaluation  Patient identified by MRN, date of birth, ID band Patient awake    Reviewed: Allergy & Precautions, H&P , NPO status , Patient's Chart, lab work & pertinent test results, reviewed documented beta blocker date and time   History of Anesthesia Complications Negative for: history of anesthetic complications  Airway Mallampati: II TM Distance: >3 FB Neck ROM: full    Dental  (+) Teeth Intact   Pulmonary asthma ,    + wheezing (right upper)      Cardiovascular negative cardio ROS  Rhythm:regular Rate:Normal     Neuro/Psych negative neurological ROS  negative psych ROS   GI/Hepatic negative GI ROS, Neg liver ROS,   Endo/Other  negative endocrine ROS  Renal/GU negative Renal ROS     Musculoskeletal   Abdominal   Peds  Hematology negative hematology ROS (+)   Anesthesia Other Findings   Reproductive/Obstetrics (+) Pregnancy (h/o c/s x1, for repeat)                           Anesthesia Physical Anesthesia Plan  ASA: II  Anesthesia Plan: Spinal   Post-op Pain Management:    Induction:   Airway Management Planned:   Additional Equipment:   Intra-op Plan:   Post-operative Plan:   Informed Consent: I have reviewed the patients History and Physical, chart, labs and discussed the procedure including the risks, benefits and alternatives for the proposed anesthesia with the patient or authorized representative who has indicated his/her understanding and acceptance.     Plan Discussed with: Surgeon and CRNA  Anesthesia Plan Comments:         Anesthesia Quick Evaluation

## 2014-06-29 ENCOUNTER — Inpatient Hospital Stay (HOSPITAL_COMMUNITY): Payer: BC Managed Care – PPO

## 2014-06-29 LAB — CBC
HCT: 34.8 % — ABNORMAL LOW (ref 36.0–46.0)
Hemoglobin: 11.4 g/dL — ABNORMAL LOW (ref 12.0–15.0)
MCH: 29 pg (ref 26.0–34.0)
MCHC: 32.8 g/dL (ref 30.0–36.0)
MCV: 88.5 fL (ref 78.0–100.0)
Platelets: 171 10*3/uL (ref 150–400)
RBC: 3.93 MIL/uL (ref 3.87–5.11)
RDW: 13.9 % (ref 11.5–15.5)
WBC: 7.4 10*3/uL (ref 4.0–10.5)

## 2014-06-29 LAB — BIRTH TISSUE RECOVERY COLLECTION (PLACENTA DONATION)

## 2014-06-29 LAB — CCBB MATERNAL DONOR DRAW

## 2014-06-29 MED ORDER — ALBUTEROL SULFATE (2.5 MG/3ML) 0.083% IN NEBU
2.5000 mg | INHALATION_SOLUTION | RESPIRATORY_TRACT | Status: DC | PRN
  Administered 2014-06-29: 2.5 mg via RESPIRATORY_TRACT
  Filled 2014-06-29: qty 3

## 2014-06-29 MED ORDER — PNEUMOCOCCAL VAC POLYVALENT 25 MCG/0.5ML IJ INJ
0.5000 mL | INJECTION | INTRAMUSCULAR | Status: AC
  Administered 2014-06-30: 0.5 mL via INTRAMUSCULAR
  Filled 2014-06-29: qty 0.5

## 2014-06-29 MED ORDER — IPRATROPIUM BROMIDE 0.02 % IN SOLN
0.5000 mg | Freq: Four times a day (QID) | RESPIRATORY_TRACT | Status: DC
  Administered 2014-06-29 – 2014-06-30 (×2): 0.5 mg via RESPIRATORY_TRACT
  Filled 2014-06-29 (×8): qty 2.5

## 2014-06-29 NOTE — Op Note (Signed)
`````  Attestation of Attending Supervision of Advanced Practitioner: Evaluation and management procedures were performed by the PA/NP/CNM/OB Fellow under my supervision/collaboration. Chart reviewed and agree with management and plan.  Petrona Wyeth V 06/29/2014 1:59 PM

## 2014-06-29 NOTE — Lactation Note (Signed)
This note was copied from the chart of Misty Unknown Flannigan. Lactation Consultation Note  Ex BF.  Mother requested assistance with reviewing positions. Assisted mother in placing in baby football hold. Sucks and some swallows observed. Reviewed monitoring voids/stools and cluster feeding. Provided mother with a hand pump.  Patient Name: Misty Salas [id number] Date: 06/29/2014 Reason for consult: Follow-up assessment   Maternal Data    Feeding Feeding Type: Breast Fed Length of feed: 20 min  LATCH Score/Interventions Latch: Grasps breast easily, tongue down, lips flanged, rhythmical sucking.  Audible Swallowing: A few with stimulation Intervention(s): Skin to skin;Hand expression;Alternate breast massage  Type of Nipple: Everted at rest and after stimulation  Comfort (Breast/Nipple): Filling, red/small blisters or bruises, mild/mod discomfort  Problem noted: Mild/Moderate discomfort  Hold (Positioning): Assistance needed to correctly position infant at breast and maintain latch. (Reviewed positions)  LATCH Score: 7  Lactation Tools Discussed/Used     Consult Status Consult Status: Follow-up Date: 06/30/14 Follow-up type: In-patient    Dahlia Byes [facility] 06/29/2014, 3:32 PM

## 2014-06-29 NOTE — Progress Notes (Signed)
Post Operative Day 1 Subjective: no complaints, up ad lib, tolerating PO, + flatus and without nausea or vomiting. Pt reports no fevers overnight. Appropriate lochia without clots. Foley recently removed.  Objective: Blood pressure 112/62, pulse 71, temperature 99.1 F (37.3 C), temperature source Oral, resp. rate 18, height  (1.651 m), weight 83.008 kg (183 lb), last menstrual period 10/14/2013, SpO2 96.00%, unknown if currently breastfeeding.  Physical Exam:  General: alert, cooperative and no distress Lochia: appropriate Uterine Fundus: firm Incision: healing well, no significant drainage, no significant erythema DVT Evaluation: No evidence of DVT seen on physical exam. Negative Homan's sign. No cords or calf tenderness. No significant calf/ankle edema.   Recent Labs  06/27/14 1555 06/29/14 0530  HGB 12.0 11.4*  HCT 35.4* 34.8*    Assessment/Plan: Plan for discharge tomorrow, Breastfeeding and Contraception Nexplanon   LOS: 1 day   Azucena Cecil 06/29/2014, 7:36 AM   I have seen and examined this patient and agree the above assessment. CRESENZO-DISHMAN,Annabell Oconnor 06/29/2014 8:00 AM

## 2014-06-29 NOTE — Progress Notes (Signed)
Patient endorsing increased wheezing with a non-productive cough. Uncomfortable for her to cough due to surgical incision. Denies fever/chills.  Has had an increased need for albuterol from her baseline, as she feels her wheezing returns a few hours after first treatment.  Filed Vitals:   06/29/14 0903  BP: 124/62  Pulse: 80  Temp: 99.4 F (37.4 C)  Resp: 18   RRR, No m/r/g noted Inspiratory and expiratory wheezing noted bilaterally  Pt is s/p RLTCS. Given concerns for aspiration PNA, will order CXR. O2 sat 98-100% on RA.  Will also increase PRN frequency of albuterol nebulizers

## 2014-06-30 ENCOUNTER — Encounter (HOSPITAL_COMMUNITY): Payer: Self-pay | Admitting: Obstetrics and Gynecology

## 2014-06-30 MED ORDER — OXYCODONE-ACETAMINOPHEN 5-325 MG PO TABS: 1.0000 | ORAL_TABLET | Freq: Four times a day (QID) | ORAL | Status: AC | PRN

## 2014-06-30 MED ORDER — SENNOSIDES-DOCUSATE SODIUM 8.6-50 MG PO TABS: 2.0000 | ORAL_TABLET | ORAL | Status: AC

## 2014-06-30 MED ORDER — OXYCODONE-ACETAMINOPHEN 5-325 MG PO TABS: 1.0000 | ORAL_TABLET | Freq: Four times a day (QID) | ORAL | Status: DC | PRN

## 2014-06-30 MED ORDER — IBUPROFEN 600 MG PO TABS: 600.0000 mg | ORAL_TABLET | Freq: Four times a day (QID) | ORAL | Status: AC

## 2014-06-30 NOTE — Discharge Instructions (Signed)

## 2014-06-30 NOTE — Discharge Summary (Signed)
Physician Obstetric Discharge Summary  Patient ID: Misty Salas MRN: [id number] DOB/AGE: [birth year] 33 y.o.  Reason for Admission: cesarean section Prenatal Procedures: ultrasound Intrapartum Procedures: cesarean: low cervical, transverse Postpartum Procedures: none Complications-Operative and Postpartum: none  Delivery Note INDICATIONS: Misty Salas is a 33 y.o. [id number] at [redacted]w[redacted]d here for cesarean section secondary to previous c-section. The risks of cesarean section were discussed with the patient including but were not limited to: bleeding which may require transfusion or reoperation; infection which may require antibiotics; injury to bowel, bladder, ureters or other surrounding organs; injury to the fetus; need for additional procedures including hysterectomy in the event of a life-threatening hemorrhage; placental abnormalities wth subsequent pregnancies, incisional problems, thromboembolic phenomenon and other postoperative/anesthesia complications. The patient concurred with the proposed plan, giving informed written consent for the procedure.  FINDINGS: Viable female infant in cephalic presentation. Apgars 9 and 9. Clear amniotic fluid. Intact placenta, three vessel cord. Normal uterus, fallopian tubes and ovaries bilaterally.   ANESTHESIA: Spinal  INTRAVENOUS FLUIDS: 2000 ml  ESTIMATED BLOOD LOSS: 500 ml  URINE OUTPUT: 150 ml  SPECIMENS: Placenta sent to L&D  COMPLICATIONS: None immediate   PROCEDURE IN DETAIL: The patient preoperatively received intravenous antibiotics and had sequential compression devices applied to her lower extremities. She was then taken to the operating room where spinal anesthesia was administered and was found to be adequate. She was then placed in a dorsal supine position with a leftward tilt, and prepped and draped in a sterile manner. A foley catheter was placed into her bladder and attached to constant gravity. After an adequate timeout was performed, a  Pfannenstiel skin incision was made with scalpel, bovi used to cut through to the underlying layer of fascia. The fascia was incised in the midline, and this incision was extended bilaterally using the Mayo scissors. Kocher clamps were applied to the superior aspect of the fascial incision and the underlying rectus muscles were dissected off bluntly. A similar process was carried out on the inferior aspect of the fascial incision. The rectus muscles were separated in the midline bluntly and the peritoneum was entered bluntly. Attention was turned to the lower uterine segment where a low transverse hysterotomy was made with a scalpel and extended bilaterally bluntly. The infant was successfully delivered, the cord was clamped and cut and the infant was handed over to awaiting neonatology team. Uterine massage was then administered, and the placenta delivered intact with a three-vessel cord. The uterus was then cleared of clot and debris. The hysterotomy was closed with 0 monocryl in a running locked fashion, and an imbricating layer was also placed with 0 monocryl. The pelvis was cleared of all clot and debris. Hemostasis was confirmed on all surfaces. The peritoneum and the muscles were reapproximated using 0 Vicryl running sutures. The fascia was then closed using 0 Vicryl in a running fashion. The subcutaneous layer was irrigated, then reapproximated with 2-0 plain gut interrupted stitches. The skin was closed with a 4-0 Vicryl subcuticular stitch. The patient tolerated the procedure well. Sponge, lap, instrument and needle counts were correct x 2. She was taken to the recovery room in stable condition.    H/H:  Lab Results  Component Value Date/Time   HGB 11.4* 06/29/2014  5:30 AM   HGB 13.1 12/22/2013   HCT 34.8* 06/29/2014  5:30 AM   HCT 38 12/22/2013    Brief Hospital Course: Misty Salas is a [id number] who underwent cesarean section on 06/28/2014.  Patient had an  uncomplicated surgery; for further  details of this surgery, please refer to the operative note.  Patient had an uncomplicated postpartum course.  By time of discharge on POD/PPD#2, her pain was controlled on oral pain medications; she had appropriate lochia and was ambulating, voiding without difficulty, tolerating regular diet and passing flatus.  She was requiring albuterol twice daily prior to discharge. CXR was performed due to mild concerns of aspiration pneumonia which was unremarkable. She did not feel like this was an asthma exacerbation, only some increased wheezing without SOB or other symptoms. She was advised to seek medical assistance if her symptoms did not improve or worsened.  She was deemed stable for discharge to home.    Physical Exam:  General: alert, cooperative and no distress Lochia: appropriate Uterine Fundus: firm Incision: healing well, no significant drainage, no dehiscence, no significant erythema DVT Evaluation: No evidence of DVT seen on physical exam. Negative Homan's sign. No cords or calf tenderness. No significant calf/ankle edema.  Discharge Diagnoses: Term Pregnancy-delivered  Discharge Information: Date: 06/30/2014 Activity: pelvic rest Diet: routine Baby feeding: plans to breastfeed Contraception: Nexplanon Medications: PNV, Ibuprofen, Colace and Percocet Discharged Condition: good Instructions: refer to practice specific booklet Discharge to: home  Signed: Rodrigo Ran, M.D. FM PGY-1  06/30/2014, 7:48 AM  I have seen and examined this patient and I agree with the above. Cam Hai CNM 9:24 AM 06/30/2014

## 2014-07-01 ENCOUNTER — Ambulatory Visit: Payer: Self-pay

## 2014-07-01 NOTE — Lactation Note (Signed)
This note was copied from the chart of Misty Salas. Lactation Consultation Note  Patient Name: Misty Salas Misty Salas Date: 07/01/2014 Reason for consult: Follow-up assessment;Other (Comment) (baby patient due to high NAS scores)  This baby received a bottle-feeding of ebm about an hour ago and is swaddled and content in mother's arms.  Mom has been using a hand pump and pumping her milk today, obtaining about 2 oz from each breast.  She anticipates receiving an electric pump from her insurance carrier but for now, LC provided DEBP and instructions for use q3h which will save her time and hopefully stimulate even more milk production.  LC reviewed use of pump on standard setting since mom's milk flow is abundant and reviewed adjusting suction per mom's comfort.  LC also reviewed milk storage guidelines (Baby and Me, page 25).   Maternal Data    Feeding    LATCH Score/Interventions            LATCH score=10 at last breastfeeding last evening at 2100)           Lactation Tools Discussed/Used Pump Review: Setup, frequency, and cleaning;Milk Storage Initiated by:: [name], RN, IBCLC Date initiated:: 07/01/14   Consult Status Consult Status: Follow-up Date: 07/02/14 Follow-up type: In-patient    Warrick Parisian [facility] 07/01/2014, 5:21 PM

## 2014-07-02 ENCOUNTER — Ambulatory Visit: Payer: Self-pay

## 2014-07-02 NOTE — Discharge Summary (Signed)
Attestation of Attending Supervision of Advanced Practitioner (CNM/NP): Evaluation and management procedures were performed by the Advanced Practitioner under my supervision and collaboration.  I have reviewed the Advanced Practitioner's note and chart, and I agree with the management and plan.  HARRAWAY-SMITH, Caniyah Murley 11:07 AM     

## 2014-07-02 NOTE — Addendum Note (Signed)
Addendum created 07/02/14 1512 by Dana Allan, MD   Modules edited: Anesthesia Attestations

## 2014-07-02 NOTE — Lactation Note (Signed)
This note was copied from the chart of Misty Salas. Lactation Consultation Note Mom is pumping w/DEBP and bottle feeding. Asked mom was she going to try to put the baby to the breast, mom stated I've tried and she cries, she likes the bottle better. I asked mom what was her plans, that LC would assist the baby to latching on breast if that's what she wanted to do. Mom stated that she may try later, that she is taking the bottle well and she may cont. To pump and bottle feed. Encouraged mom to call out if needs assistance. Patient Name: Misty Salas EAVWU'J Date: 07/02/2014 Reason for consult: Follow-up assessment;Other (Comment) (pump and bottle)   Maternal Data    Feeding Feeding Type: Bottle Fed - Breast Milk Nipple Type: Slow - flow  LATCH Score/Interventions                      Lactation Tools Discussed/Used     Consult Status Consult Status: Follow-up Date: 07/02/14 Follow-up type: In-patient    Charyl Dancer 07/02/2014, 6:31 AM

## 2014-07-03 ENCOUNTER — Ambulatory Visit: Payer: Self-pay

## 2014-07-03 NOTE — Lactation Note (Signed)
This note was copied from the chart of Misty Salas. Lactation Consultation Note  Patient Name: Misty Salas EAVWU'J Date: 07/03/2014   Visited with Mom, baby 19 days old.  Baby swaddled sleeping with pacifier.  Mom pumping regularly, and obtaining average of 4 oz per pumping.  Baby taking 2-4 oz per feeding, on cue. Has a DEBP en route through her insurance company, also has a single electric pump at home.  Offered short term rental, but Mom declined.  Hoping to be discharged today.  Reminded Mom of OP Lactation services available.  Encouraged her to call prn.  Judee Clara 07/03/2014, 10:00 AM

## 2014-07-10 ENCOUNTER — Encounter: Payer: BC Managed Care – PPO | Admitting: Obstetrics and Gynecology

## 2014-07-18 ENCOUNTER — Ambulatory Visit (INDEPENDENT_AMBULATORY_CARE_PROVIDER_SITE_OTHER): Payer: BC Managed Care – PPO | Admitting: Obstetrics and Gynecology

## 2014-07-18 ENCOUNTER — Encounter: Payer: Self-pay | Admitting: Obstetrics and Gynecology

## 2014-07-18 VITALS — BP 104/66 | Ht 65.0 in | Wt 169.0 lb

## 2014-07-18 DIAGNOSIS — Z308 Encounter for other contraceptive management: Secondary | ICD-10-CM

## 2014-07-18 DIAGNOSIS — Z309 Encounter for contraceptive management, unspecified: Secondary | ICD-10-CM | POA: Insufficient documentation

## 2014-07-18 DIAGNOSIS — Z9889 Other specified postprocedural states: Secondary | ICD-10-CM

## 2014-07-18 NOTE — Progress Notes (Signed)
Patient ID: Misty Salas, female   DOB: 1981/01/16, 33 y.o.   MRN: [id number] Pt here today for post op visit. Pt states that everything is good, and incision looks good, no redness or drainage.

## 2014-07-18 NOTE — Progress Notes (Signed)
This chart was scribed by Leone Payor, Medical Scribe, for Dr. Christin Bach on 07/18/14 at 3:36 PM. This chart was reviewed by Dr. Christin Bach for accuracy.    Subjective:  Misty Salas is a 33 y.o. female who presents to the clinic 3 weeks status post cesarean.    Review of Systems Negative except noted in the HPI  She has been eating a regular diet without difficulty.   Bowel movements are normal. The patient is not having any pain.  Objective:  BP 104/66  Ht  (1.651 m)  Wt 169 lb (76.658 kg)  BMI 28.12 kg/m2  Breastfeeding? Yes General:Well developed, well nourished.  No acute distress. Abdomen: Bowel sounds normal, soft, non-tender. Pelvic Exam: not indicated   Incision(s):   Healing well, no drainage, no erythema, no hernia, no swelling, no dehiscence, incision well approximated.   Assessment:  Post-Op 3 weeks s/p cesarean    Doing well postoperatively.   Plan:  1.Wound care discussed   2. .Continue any current medications. 3. Activity restrictions: avoid sexual activity  4. return to work: 1-2 weeks. 5. Follow up in 6 days for nexplanon

## 2014-07-25 ENCOUNTER — Encounter: Payer: Self-pay | Admitting: *Deleted

## 2014-07-25 ENCOUNTER — Ambulatory Visit: Payer: BC Managed Care – PPO | Admitting: Obstetrics and Gynecology

## 2014-07-25 ENCOUNTER — Ambulatory Visit: Payer: BC Managed Care – PPO | Admitting: Women's Health

## 2014-08-27 ENCOUNTER — Telehealth: Payer: Self-pay | Admitting: *Deleted

## 2014-08-27 NOTE — Telephone Encounter (Signed)
Return to work noted faxed per pt request.

## 2014-09-03 ENCOUNTER — Encounter: Payer: Self-pay | Admitting: Obstetrics and Gynecology

## 2014-09-26 ENCOUNTER — Emergency Department (HOSPITAL_COMMUNITY): Payer: BC Managed Care – PPO

## 2014-09-26 ENCOUNTER — Encounter (HOSPITAL_COMMUNITY): Payer: Self-pay | Admitting: Cardiology

## 2014-09-26 ENCOUNTER — Emergency Department (HOSPITAL_COMMUNITY)
Admission: EM | Admit: 2014-09-26 | Discharge: 2014-09-26 | Disposition: A | Discharge status: Home / Self Care | Payer: BC Managed Care – PPO | Attending: Emergency Medicine | Admitting: Emergency Medicine

## 2014-09-26 DIAGNOSIS — Z7952 Long term (current) use of systemic steroids: Secondary | ICD-10-CM | POA: Insufficient documentation

## 2014-09-26 DIAGNOSIS — J45901 Unspecified asthma with (acute) exacerbation: Secondary | ICD-10-CM | POA: Diagnosis not present

## 2014-09-26 DIAGNOSIS — J181 Lobar pneumonia, unspecified organism: Secondary | ICD-10-CM

## 2014-09-26 DIAGNOSIS — Z79899 Other long term (current) drug therapy: Secondary | ICD-10-CM | POA: Diagnosis not present

## 2014-09-26 DIAGNOSIS — J189 Pneumonia, unspecified organism: Secondary | ICD-10-CM | POA: Insufficient documentation

## 2014-09-26 DIAGNOSIS — R0602 Shortness of breath: Secondary | ICD-10-CM | POA: Diagnosis present

## 2014-09-26 MED ORDER — ALBUTEROL SULFATE HFA 108 (90 BASE) MCG/ACT IN AERS
2.0000 | INHALATION_SPRAY | Freq: Once | RESPIRATORY_TRACT | Status: AC
  Administered 2014-09-26: 2 via RESPIRATORY_TRACT
  Filled 2014-09-26: qty 6.7

## 2014-09-26 MED ORDER — ALBUTEROL SULFATE HFA 108 (90 BASE) MCG/ACT IN AERS: 1.0000 | INHALATION_SPRAY | Freq: Four times a day (QID) | RESPIRATORY_TRACT | Status: AC | PRN

## 2014-09-26 MED ORDER — LEVOFLOXACIN 500 MG PO TABS: 500.0000 mg | ORAL_TABLET | Freq: Every day | ORAL | Status: AC

## 2014-09-26 MED ORDER — IPRATROPIUM-ALBUTEROL 0.5-2.5 (3) MG/3ML IN SOLN
3.0000 mL | Freq: Once | RESPIRATORY_TRACT | Status: AC
  Administered 2014-09-26: 3 mL via RESPIRATORY_TRACT
  Filled 2014-09-26: qty 3

## 2014-09-26 MED ORDER — FAMOTIDINE 20 MG PO TABS
20.0000 mg | ORAL_TABLET | Freq: Once | ORAL | Status: AC
  Administered 2014-09-26: 20 mg via ORAL
  Filled 2014-09-26: qty 1

## 2014-09-26 MED ORDER — RANITIDINE HCL 150 MG PO TABS: 150.0000 mg | ORAL_TABLET | Freq: Two times a day (BID) | ORAL | Status: AC

## 2014-09-26 MED ORDER — IPRATROPIUM-ALBUTEROL 0.5-2.5 (3) MG/3ML IN SOLN: 3.0000 mL | Freq: Four times a day (QID) | RESPIRATORY_TRACT | Status: AC | PRN

## 2014-09-26 MED ORDER — FLUTICASONE-SALMETEROL 100-50 MCG/DOSE IN AEPB: 1.0000 | INHALATION_SPRAY | Freq: Two times a day (BID) | RESPIRATORY_TRACT | Status: AC | PRN

## 2014-09-26 MED ORDER — IOHEXOL 350 MG/ML SOLN
100.0000 mL | Freq: Once | INTRAVENOUS | Status: AC | PRN
  Administered 2014-09-26: 100 mL via INTRAVENOUS

## 2014-09-26 NOTE — ED Provider Notes (Signed)
CSN: [id number]     Arrival date & time 09/26/14  1191 History   First MD Initiated Contact with Patient 09/26/14 0857     Chief Complaint  Patient presents with  . Shortness of Breath     (Consider location/radiation/quality/duration/timing/severity/associated sxs/prior Treatment) Patient is a 33 y.o. female presenting with shortness of breath. The history is provided by the patient.  Shortness of Breath Severity:  Moderate Onset quality:  Gradual Duration:  3 months Timing:  Constant Progression:  Worsening Chronicity:  New Relieved by:  Nothing Worsened by:  Activity Ineffective treatments:  Inhaler, rest and sitting up (antibiotics) Associated symptoms: cough and wheezing    Misty Salas is a 33 y.o. female who presents to the ED with shortness of breath. She states that she had C/S two months ago and while in the hospital she had an asthma attack. She had a chest x-ray at that time that was normal. She has continued to have shortness of breath despite using her inhaler. She went to Urgent Care 2 weeks ago and was given breathing treatments, steroids and a ZPak. She improved some but a couple days after she stopped the medication the symptoms returned worse than before. She returned to Urgent Care yesterday and her O2 Sat was 90. She had breathing treatments, more steroids and a chest x-ray. The doctor called her and told her the chest x-ray was abnormal and she needed to come to the hospital for a CT scan of her chest.  Past Medical History  Diagnosis Date  . Supervision of normal pregnancy in second trimester 01/23/2014  . Asthma     WELL CONTROLLED   Past Surgical History  Procedure Laterality Date  . Cesarean section    . Wisdom tooth extraction    . Cesarean section N/A 06/28/2014    Procedure: CESAREAN SECTION REPEAT;  Surgeon: [name], MD;  Location: [facility];  Service: Obstetrics;  Laterality: N/A;   Family History  Problem Relation Age of Onset  . Asthma  Sister   . Diabetes Paternal Uncle   . Cancer Paternal Uncle     skin  . Cancer Maternal Grandmother     ovarian   . Heart attack Maternal Grandfather   . Diabetes Paternal Grandfather    History  Substance Use Topics  . Smoking status: Never Smoker   . Smokeless tobacco: Never Used  . Alcohol Use: Yes     Comment: none during pregnancy   OB History    Gravida Para Term Preterm AB TAB SAB Ectopic Multiple Living   3 2 2  1     2      Review of Systems  Respiratory: Positive for cough, shortness of breath and wheezing.   all other systems negative    Allergies  Review of patient's allergies indicates no known allergies.  Home Medications   Prior to Admission medications   Medication Sig Start Date End Date Taking? Authorizing Provider  predniSONE (DELTASONE) 20 MG tablet Take 20-40 mg by mouth See admin instructions. Starting 09/26/2014: 2 tabs x 5 days, then 1 tab x 5 days. 09/25/14  Yes Historical Provider, MD  Prenatal Vit-Fe Fumarate-FA (PRENATAL MULTIVITAMIN) TABS tablet Take 1 tablet by mouth daily at 12 noon.   Yes Historical Provider, MD  PROAIR HFA 108 (90 BASE) MCG/ACT inhaler Inhale 2 puffs into the lungs every 2 (two) hours as needed for wheezing or shortness of breath.  09/25/14  Yes Historical Provider, MD  ibuprofen (ADVIL,MOTRIN)  600 MG tablet Take 1 tablet (600 mg total) by mouth every 6 (six) hours. Patient not taking: Reported on 09/26/2014 06/30/14   Joanna Puffrystal S Dorsey, MD  oxyCODONE-acetaminophen (PERCOCET/ROXICET) 5-325 MG per tablet Take 1-2 tablets by mouth every 6 (six) hours as needed for severe pain (moderate - severe pain). Patient not taking: Reported on 09/26/2014 06/30/14   Joanna Puffrystal S Dorsey, MD  senna-docusate (SENOKOT-S) 8.6-50 MG per tablet Take 2 tablets by mouth daily. Patient not taking: Reported on 09/26/2014 06/30/14   Joanna Puffrystal S Dorsey, MD   BP 126/86 mmHg  Pulse 67  Temp(Src) 98.3 F (36.8 C) (Oral)  Resp 20  Ht 5\' 5"  (1.651 m)  Wt 170  lb (77.111 kg)  BMI 28.29 kg/m2  SpO2 99%  LMP 09/26/2014 Physical Exam  Constitutional: She is oriented to person, place, and time. She appears well-developed and well-nourished.  HENT:  Head: Normocephalic.  Eyes: Conjunctivae and EOM are normal.  Neck: Neck supple.  Cardiovascular: Normal rate and regular rhythm.   Pulmonary/Chest: She has decreased breath sounds. She has wheezes.  Inspiratory and expiratory wheezing bilateral.   Musculoskeletal: Normal range of motion.  Neurological: She is alert and oriented to person, place, and time. No cranial nerve deficit.  Skin: Skin is warm and dry.  Psychiatric: She has a normal mood and affect. Her behavior is normal.  Nursing note and vitals reviewed.   ED Course  Procedures  CT angio chest, DuoNeb x2 Patient improved after 1st treatment but continues to have wheezing and decreased breath sounds. Neb repeated.  @ 1130 patient much improved, occasional wheezing heard.   Ct Angio Chest Pe W/cm &/or Wo Cm  09/26/2014   CLINICAL DATA:  Initial encounter. Difficulty breathing for 3 months.  EXAM: CT ANGIOGRAPHY CHEST WITH CONTRAST  TECHNIQUE: Multidetector CT imaging of the chest was performed using the standard protocol during bolus administration of intravenous contrast. Multiplanar CT image reconstructions and MIPs were obtained to evaluate the vascular anatomy.  CONTRAST:  100mL OMNIPAQUE IOHEXOL 350 MG/ML SOLN  COMPARISON:  Chest radiograph 06/29/2014.  FINDINGS: Bones: Normal.  Cardiovascular: Technically adequate study. Negative for pulmonary embolism. Residual thymic tissue in the anterior mediastinum. Heart appears within normal limits. Aorta appears within normal limits.  Lungs: Mild dependent atelectasis. No consolidation. Scattered areas of ground-glass attenuation are present, with apical predominance. There is another area ground-glass attenuation in the medial LEFT lower lobe.  Central airways: Trachea and mainstem bronchi appear  within normal limits. Debris is present within the dependent RIGHT lower lobe segmental bronchi at the base (image 55 series 6).  Effusions: None.  Lymphadenopathy: None.  Esophagus: Normal.  Upper abdomen: Normal.  Other:  None.  Review of the MIP images confirms the above findings.  IMPRESSION: 1. Negative for pulmonary embolus. 2. Scattered areas of ground-glass attenuation, greater on the LEFT than RIGHT. These areas are nonspecific and can be associated with small airway/small vessel disease, interstitial pneumonia or eosinophilic pneumonia. Infection is unlikely based on the pattern. 3. Debris in the RIGHT lower lobe bronchi, suggesting aspiration. Potentially this represents silent aspiration associated with reflux.   Electronically Signed   By: Andreas NewportGeoffrey  Lamke M.D.   On: 09/26/2014 10:39    Discussed with Dr. Juleen ChinaKohut and will treat with Levaquin and inhalers will also treat with pepcid.   MDM  33 y.o. female with shortness of breath. She has a follow up appointment with pulmonary next week. Stable for discharge with O2 SAT 97% on R/A. Speaking in  full sentences and much improved after medications here in the ED. I have reviewed this patient's vital signs, nurses notes, appropriate labs and imaging.  I have discussed findings and plan of care with the patient and she voices understanding and agrees with plan. She will return for any problems.    Medication List    TAKE these medications        albuterol 108 (90 BASE) MCG/ACT inhaler  Commonly known as:  PROVENTIL HFA;VENTOLIN HFA  Inhale 1-2 puffs into the lungs every 6 (six) hours as needed for wheezing or shortness of breath.     Fluticasone-Salmeterol 100-50 MCG/DOSE Aepb  Commonly known as:  ADVAIR DISKUS  Inhale 1 puff into the lungs every 12 (twelve) hours as needed.     ipratropium-albuterol 0.5-2.5 (3) MG/3ML Soln  Commonly known as:  DUONEB  Take 3 mLs by nebulization every 6 (six) hours as needed.     levofloxacin 500 MG tablet   Commonly known as:  LEVAQUIN  Take 1 tablet (500 mg total) by mouth daily.     ranitidine 150 MG tablet  Commonly known as:  ZANTAC  Take 1 tablet (150 mg total) by mouth 2 (two) times daily.      ASK your doctor about these medications        ibuprofen 600 MG tablet  Commonly known as:  ADVIL,MOTRIN  Take 1 tablet (600 mg total) by mouth every 6 (six) hours.     oxyCODONE-acetaminophen 5-325 MG per tablet  Commonly known as:  PERCOCET/ROXICET  Take 1-2 tablets by mouth every 6 (six) hours as needed for severe pain (moderate - severe pain).     predniSONE 20 MG tablet  Commonly known as:  DELTASONE  Take 20-40 mg by mouth See admin instructions. Starting 09/26/2014: 2 tabs x 5 days, then 1 tab x 5 days.     prenatal multivitamin Tabs tablet  Take 1 tablet by mouth daily at 12 noon.     senna-docusate 8.6-50 MG per tablet  Commonly known as:  Senokot-S  Take 2 tablets by mouth daily.         [address]Hope UticaM Neese, NP 09/26/14 1422  Raeford RazorStephen Kohut, MD 09/29/14 [phone]

## 2014-09-26 NOTE — ED Notes (Addendum)
States she has had a hard time breathing for 3 months.  Is on prednisone.  Pt ambulatory to room and talking without difficulty.  ra sat after walking back 96%.  States she was seen yesterday at the urgent care and had a chest x-ray.  Was told at that time she needed a stat chest CT and that is why she is here today.    Pt had a recent pregnancy.  Delivered 2 months ago,  Currently breast feeding.

## 2014-09-26 NOTE — Care Management Note (Signed)
ED/CM noted patient did not have health insurance and/or PCP listed in the computer.  Patient was given the [place] resource handout with information on the clinics, food pantries, and the handout for new health insurance sign-up.  Patient expressed appreciation for information received. 

## 2014-09-26 NOTE — Discharge Instructions (Signed)
Keep your appointment for follow up. If your symptoms worsen return here.

## 2015-12-18 IMAGING — CR DG CHEST 2V
2 series · 2 of 2 positions shown · non-contrast
Comparison: None.

CLINICAL DATA: 33-year-old female with wheezing and shortness of
breath. Recent postpartum

EXAM:
CHEST  2 VIEW

[view not recorded (1 of 2)]
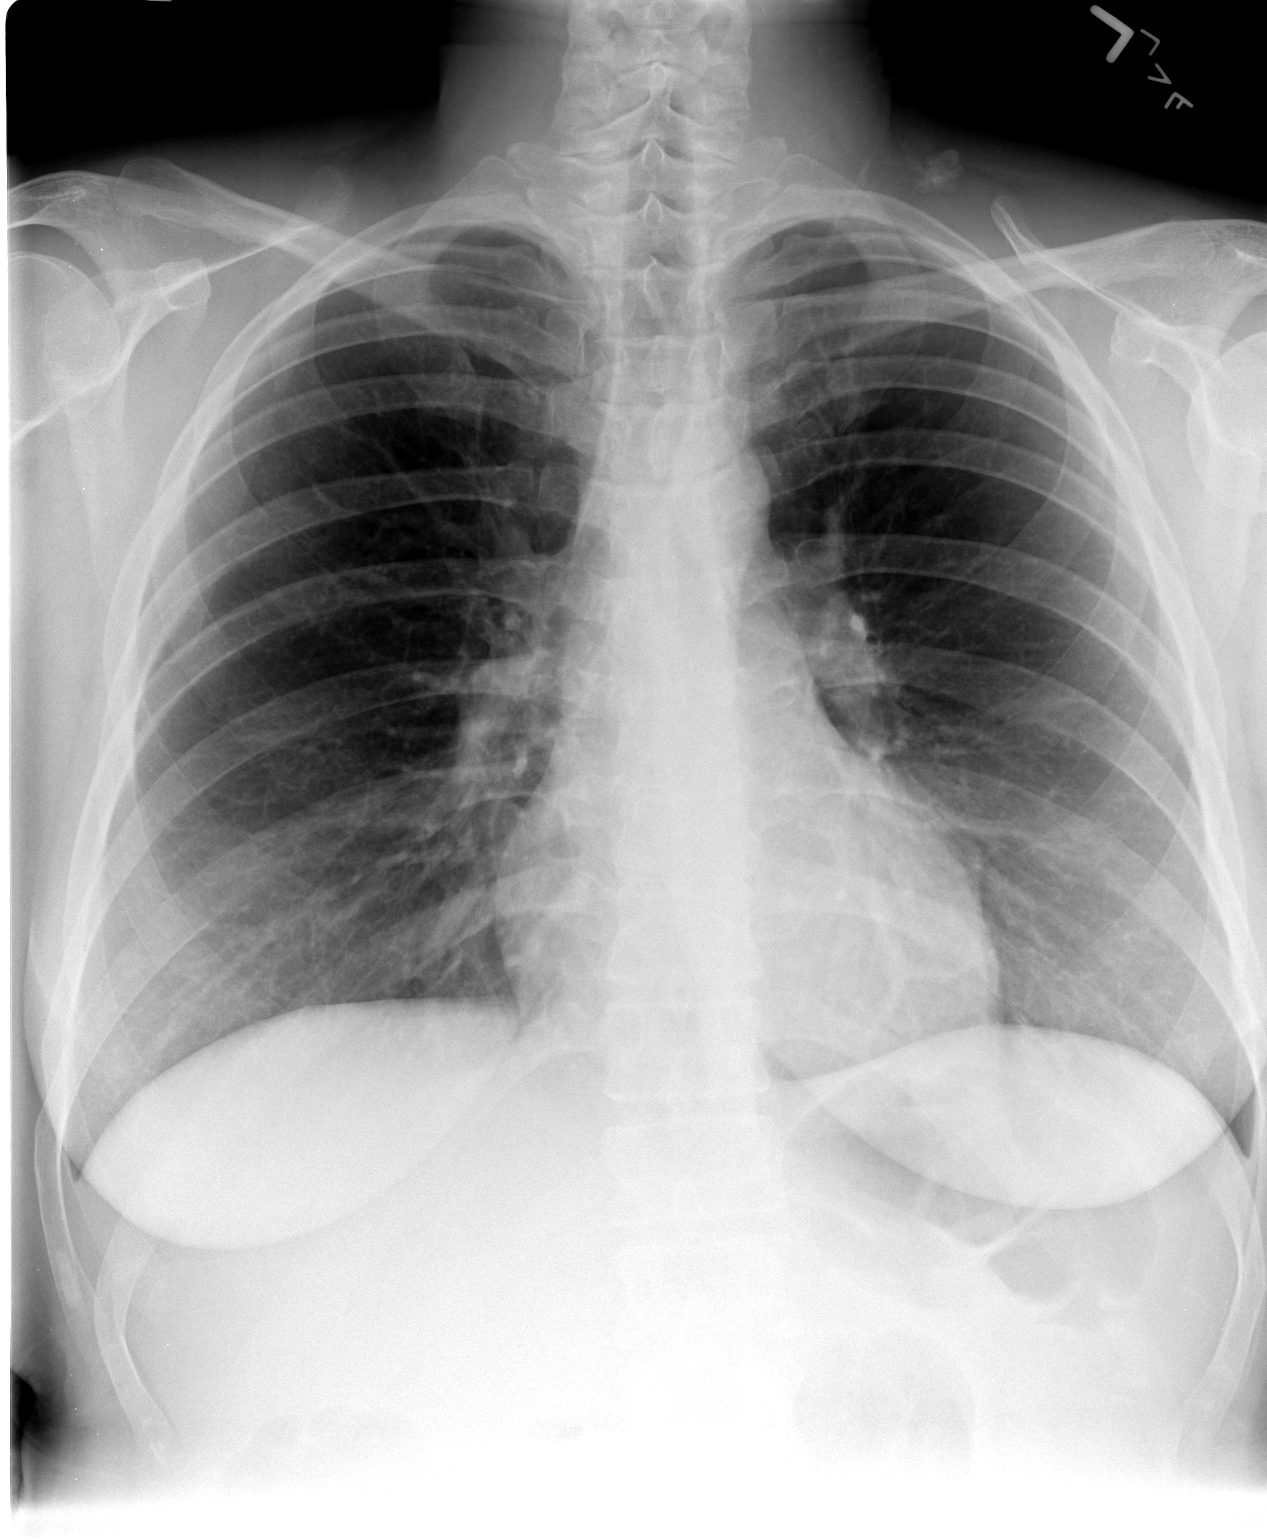

[view not recorded (2 of 2)]
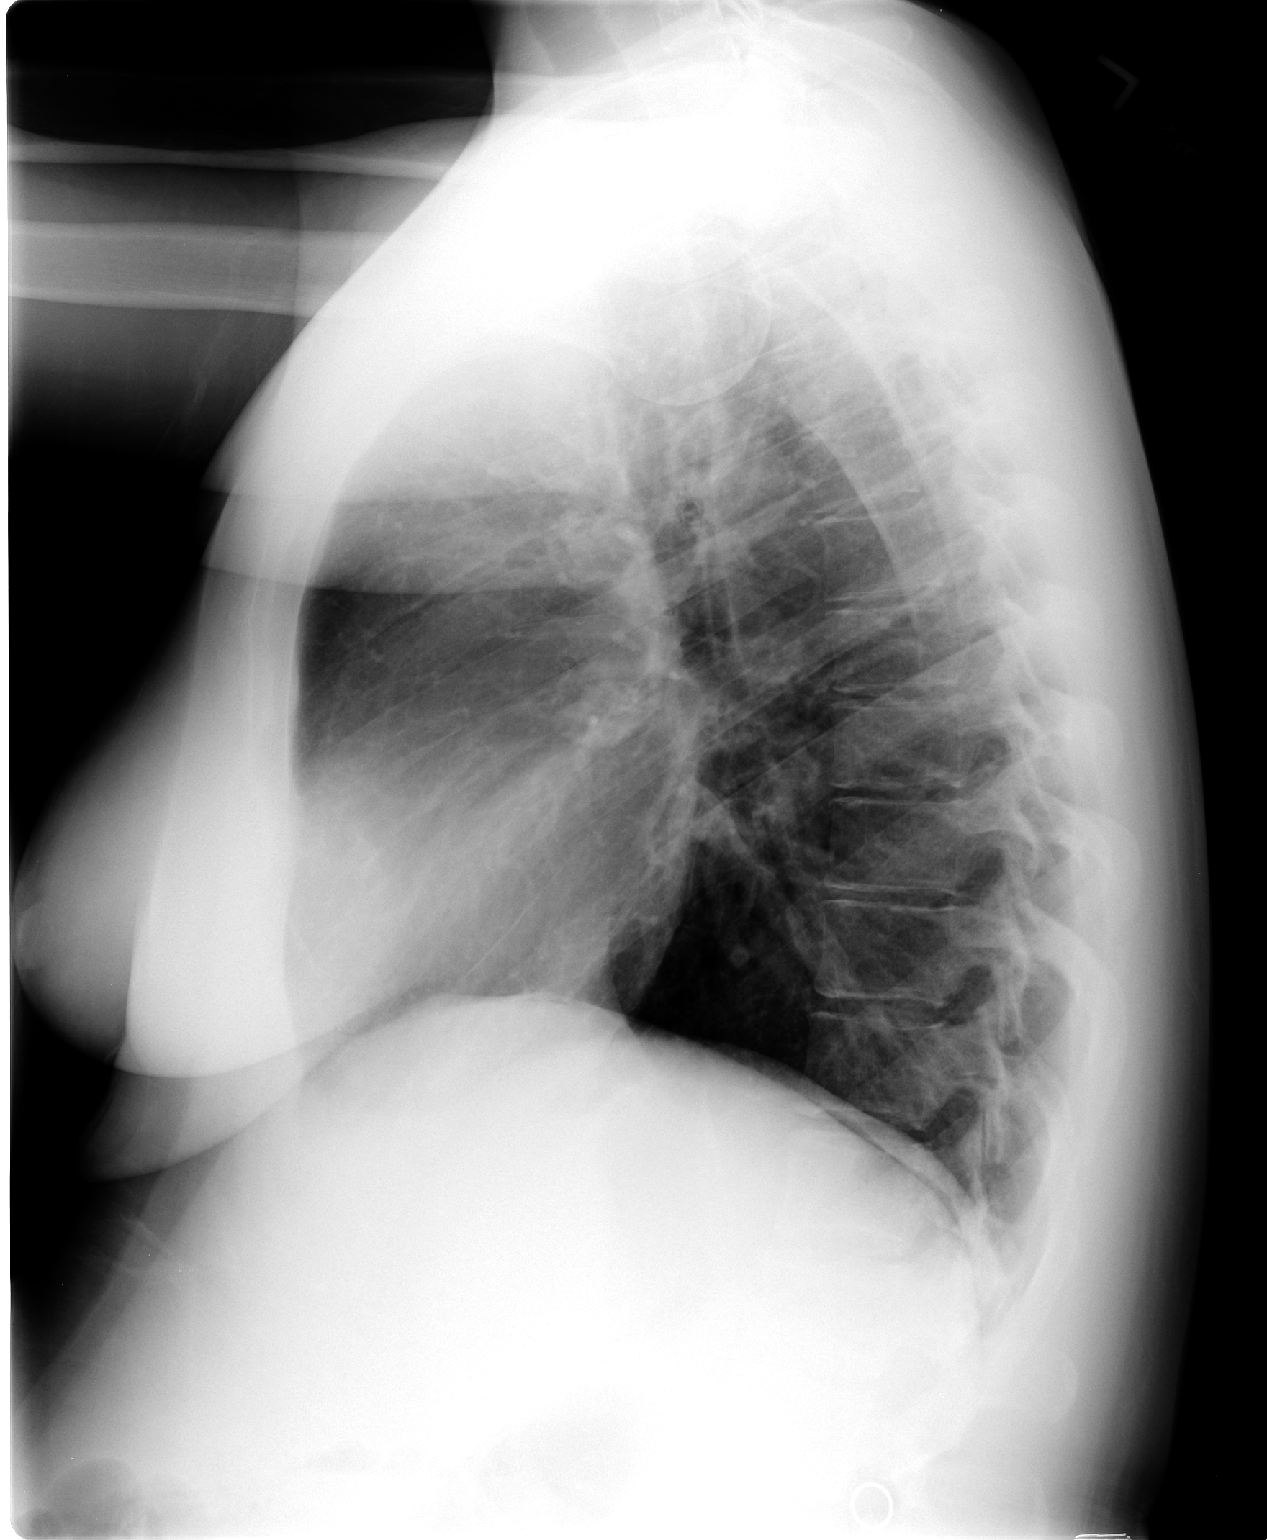

[2 of 2 positions shown; findings below may reference images not displayed]

FINDINGS: The cardiomediastinal silhouette is unremarkable.

The lungs are clear.

There is no evidence of focal airspace disease, pulmonary edema,
suspicious pulmonary nodule/mass, pleural effusion, or pneumothorax.
No acute bony abnormalities are identified.
IMPRESSION: No active cardiopulmonary disease.
